# Patient Record
Sex: Male | Born: 1965
Health system: Southern US, Community
[De-identification: ages and names within clinical notes are randomized; demographics above are authoritative.]

## PROBLEM LIST (undated history)

## (undated) DIAGNOSIS — IMO0002 Reserved for concepts with insufficient information to code with codable children: Secondary | ICD-10-CM

## (undated) DIAGNOSIS — I10 Essential (primary) hypertension: Secondary | ICD-10-CM

## (undated) DIAGNOSIS — E669 Obesity, unspecified: Secondary | ICD-10-CM

## (undated) HISTORY — DX: Obesity, unspecified: E66.9

## (undated) HISTORY — DX: Reserved for concepts with insufficient information to code with codable children: IMO0002

## (undated) HISTORY — PX: WRIST SURGERY: SHX841

## (undated) HISTORY — PX: OTHER SURGICAL HISTORY: SHX169

## (undated) HISTORY — DX: Essential (primary) hypertension: I10

---

## 2006-02-04 ENCOUNTER — Ambulatory Visit: Payer: Self-pay

## 2006-02-13 ENCOUNTER — Other Ambulatory Visit: Payer: Self-pay

## 2006-02-14 ENCOUNTER — Ambulatory Visit: Payer: Self-pay | Admitting: Unknown Physician Specialty

## 2015-01-28 ENCOUNTER — Other Ambulatory Visit: Payer: Self-pay | Admitting: Family Medicine

## 2015-03-04 ENCOUNTER — Other Ambulatory Visit: Payer: Self-pay | Admitting: Family Medicine

## 2015-03-08 DIAGNOSIS — IMO0002 Reserved for concepts with insufficient information to code with codable children: Secondary | ICD-10-CM

## 2015-03-08 HISTORY — DX: Reserved for concepts with insufficient information to code with codable children: IMO0002

## 2015-03-14 ENCOUNTER — Other Ambulatory Visit: Payer: Self-pay | Admitting: Family Medicine

## 2015-03-14 ENCOUNTER — Encounter: Payer: Self-pay | Admitting: Family Medicine

## 2015-03-14 ENCOUNTER — Ambulatory Visit (INDEPENDENT_AMBULATORY_CARE_PROVIDER_SITE_OTHER): Payer: BLUE CROSS/BLUE SHIELD | Admitting: Family Medicine

## 2015-03-14 VITALS — BP 130/73 | HR 74 | Temp 98.0°F | Resp 16 | Ht 66.5 in | Wt 264.4 lb

## 2015-03-14 DIAGNOSIS — E669 Obesity, unspecified: Secondary | ICD-10-CM | POA: Diagnosis not present

## 2015-03-14 DIAGNOSIS — I10 Essential (primary) hypertension: Secondary | ICD-10-CM | POA: Diagnosis not present

## 2015-03-14 DIAGNOSIS — M722 Plantar fascial fibromatosis: Secondary | ICD-10-CM

## 2015-03-14 DIAGNOSIS — Z6841 Body Mass Index (BMI) 40.0 and over, adult: Secondary | ICD-10-CM

## 2015-03-14 DIAGNOSIS — N509 Disorder of male genital organs, unspecified: Secondary | ICD-10-CM | POA: Diagnosis not present

## 2015-03-14 DIAGNOSIS — Z0000 Encounter for general adult medical examination without abnormal findings: Secondary | ICD-10-CM

## 2015-03-14 DIAGNOSIS — N5089 Other specified disorders of the male genital organs: Secondary | ICD-10-CM

## 2015-03-14 MED ORDER — LOSARTAN POTASSIUM-HCTZ 100-25 MG PO TABS: 1.0000 | ORAL_TABLET | Freq: Every day | ORAL | Status: DC

## 2015-03-14 MED ORDER — IBUPROFEN 200 MG PO TABS: ORAL_TABLET | ORAL | Status: DC

## 2015-03-14 NOTE — Patient Instructions (Addendum)
To get Flu shot and Tdap in 1-2 weeks after finishing antibiotics for finger infection.  Ice to foot 3-4 times a day.

## 2015-03-14 NOTE — Progress Notes (Signed)
Name: Richard Wheeler   MRN: [id number]    DOB: 10/03/1965   Date:03/14/2015       Progress Note  Subjective  Chief Complaint  Chief Complaint  Patient presents with  . Annual Exam    Some heel pain left side now and then. 2-3 months     HPI  Here for annual Complete physical exam.  Has HBP and takes meds.  C/o some L heel pain for several months, worse after sitting on a hard surface.  Also ad with first standing after sleeping.    Had infected cuticle of L 4th finger.  Being treated with antibiotics and it is healing well. No problem-specific assessment & plan notes found for this encounter.   Past Medical History  Diagnosis Date  . Hypertension   . Obesity   . Infected cuticle 03/08/2015    Left Hand 4th digit     Past Surgical History  Procedure Laterality Date  . Left knee surgery      Family History  Problem Relation Age of Onset  . Cancer Mother 4552    Social History   Social History  . Marital Status: Married    Spouse Name: N/A  . Number of Children: N/A  . Years of Education: N/A   Occupational History  . Not on file.   Social History Main Topics  . Smoking status: Never Smoker   . Smokeless tobacco: Never Used  . Alcohol Use: No  . Drug Use: No  . Sexual Activity: Not on file   Other Topics Concern  . Not on file   Social History Narrative  . No narrative on file     Current outpatient prescriptions:  .  cephALEXin (KEFLEX) 500 MG capsule, Take 500 mg by mouth 2 (two) times daily., Disp: , Rfl:  .  losartan-hydrochlorothiazide (HYZAAR) 100-25 MG tablet, Take 1 tablet by mouth daily., Disp: 30 tablet, Rfl: 12 .  Multiple Vitamin (MULTIVITAMIN WITH MINERALS) TABS tablet, Take 1 tablet by mouth daily., Disp: , Rfl:  .  sulfamethoxazole-trimethoprim (BACTRIM DS,SEPTRA DS) 800-160 MG tablet, Take 1 tablet by mouth 2 (two) times daily., Disp: , Rfl:  .  ibuprofen (ADVIL) 200 MG tablet, Take 2 tablet up to 4 times a day for foot pain, Disp: 100  tablet, Rfl: 12  No Known Allergies   Review of Systems  Constitutional: Negative for fever, chills, weight loss and malaise/fatigue.  HENT: Negative for congestion and hearing loss.   Eyes: Negative for blurred vision, double vision and pain.  Respiratory: Negative for cough, sputum production, shortness of breath and wheezing.   Cardiovascular: Negative for chest pain, palpitations, orthopnea and leg swelling.  Gastrointestinal: Positive for heartburn (occasional). Negative for nausea, vomiting, abdominal pain, diarrhea and blood in stool.  Genitourinary: Negative for dysuria, urgency and frequency.  Musculoskeletal: Positive for joint pain (L heel pain on and off.). Negative for myalgias.  Skin: Negative for rash.  Neurological: Negative for dizziness, tremors, sensory change, focal weakness, weakness and headaches.  Psychiatric/Behavioral: Negative for depression. The patient is not nervous/anxious and does not have insomnia.       Objective  Filed Vitals:   03/14/15 1509  BP: 130/73  Pulse: 74  Temp: 98 F (36.7 C)  Resp: 16  Height: 5' 6.5" (1.689 m)  Weight: 264 lb 6.4 oz (119.931 kg)    Physical Exam  Constitutional: He is oriented to person, place, and time and well-developed, well-nourished, and in no distress. No distress.  HENT:  Head: Normocephalic and atraumatic.  Right Ear: External ear normal.  Nose: Nose normal.  Mouth/Throat: Oropharynx is clear and moist.  Eyes: Conjunctivae and EOM are normal. Pupils are equal, round, and reactive to light. Right eye exhibits no discharge. Left eye exhibits no discharge. No scleral icterus.  Fundoscopic exam:      The right eye shows no arteriolar narrowing, no AV nicking, no exudate and no hemorrhage.       The left eye shows no arteriolar narrowing, no AV nicking, no exudate and no hemorrhage.  Neck: Normal range of motion. Neck supple. Carotid bruit is not present. No thyromegaly present.  Cardiovascular: Normal  rate, regular rhythm, normal heart sounds and intact distal pulses.  Exam reveals no gallop and no friction rub.   No murmur heard. Pulmonary/Chest: Effort normal and breath sounds normal. No respiratory distress. He has no wheezes. He has no rales. He exhibits no tenderness.  Abdominal: Soft. Bowel sounds are normal. He exhibits no distension, no abdominal bruit and no mass. There is no tenderness.  Genitourinary: Penis normal.  Round smooth mass (~1.5 cm) above L testis. Not tender.  Musculoskeletal: Normal range of motion. He exhibits no edema or tenderness.  L heel tender over plantar fascia insertion.  Lymphadenopathy:    He has no cervical adenopathy.  Neurological: He is alert and oriented to person, place, and time. No cranial nerve deficit.  Skin: Skin is warm and dry. No rash noted. No erythema. No pallor.  Healing cellulitis of cuticle of L. 4th finger  Psychiatric: Mood, memory, affect and judgment normal.  Vitals reviewed.      No results found for this or any previous visit (from the past 2160 hour(s)).   Assessment & Plan  Problem List Items Addressed This Visit      Cardiovascular and Mediastinum   Hypertension   Relevant Medications   losartan-hydrochlorothiazide (HYZAAR) 100-25 MG tablet   Other Relevant Orders   Lipid Profile   Comprehensive Metabolic Panel (CMET)     Musculoskeletal and Integument   Plantar fasciitis     Other   Obesity - Primary   Annual physical exam   Scrotal mass   Relevant Orders   US Scrotum   CBC with Differential      Meds ordered this encounter  Medications  . Multiple Vitamin (MULTIVITAMIN WITH MINERALS) TABS tablet    Sig: Take 1 tablet by mouth daily.  . cephALEXin (KEFLEX) 500 MG capsule    Sig: Take 500 mg by mouth 2 (two) times daily.  Marland Kitchen sulfamethoxazole-trimethoprim (BACTRIM DS,SEPTRA DS) 800-160 MG tablet    Sig: Take 1 tablet by mouth 2 (two) times daily.  Marland Kitchen losartan-hydrochlorothiazide (HYZAAR) 100-25 MG  tablet    Sig: Take 1 tablet by mouth daily.    Dispense:  30 tablet    Refill:  12  . ibuprofen (ADVIL) 200 MG tablet    Sig: Take 2 tablet up to 4 times a day for foot pain    Dispense:  100 tablet    Refill:  12    OTC   1. Annual physical exam   2. Obesity   3. Essential hypertension  - Lipid Profile - Comprehensive Metabolic Panel (CMET) - losartan-hydrochlorothiazide (HYZAAR) 100-25 MG tablet; Take 1 tablet by mouth daily.  Dispense: 30 tablet; Refill: 12  4. Scrotal mass  - US Scrotum; Future - CBC with Differential  5. Plantar fasciitis  - ibuprofen (ADVIL) 200 MG tablet; Take 2 tablet up to  4 times a day for foot pain  Dispense: 100 tablet; Refill: 12

## 2015-03-14 NOTE — Addendum Note (Signed)
Addended by: Clayborne ArtistHOLDEN, Lealer Marsland A on: 03/14/2015 04:15 PM   Modules accepted: Orders, SmartSet

## 2015-03-18 ENCOUNTER — Ambulatory Visit
Admission: RE | Admit: 2015-03-18 | Discharge: 2015-03-18 | Disposition: A | Discharge status: Home / Self Care | Payer: BLUE CROSS/BLUE SHIELD | Source: Ambulatory Visit | Attending: Family Medicine | Admitting: Family Medicine

## 2015-03-18 DIAGNOSIS — N433 Hydrocele, unspecified: Secondary | ICD-10-CM | POA: Diagnosis not present

## 2015-03-18 DIAGNOSIS — N509 Disorder of male genital organs, unspecified: Secondary | ICD-10-CM | POA: Diagnosis present

## 2015-03-18 DIAGNOSIS — N503 Cyst of epididymis: Secondary | ICD-10-CM | POA: Diagnosis not present

## 2015-03-18 DIAGNOSIS — N5089 Other specified disorders of the male genital organs: Secondary | ICD-10-CM

## 2015-03-22 ENCOUNTER — Other Ambulatory Visit: Payer: Self-pay | Admitting: Family Medicine

## 2015-03-22 DIAGNOSIS — L729 Follicular cyst of the skin and subcutaneous tissue, unspecified: Secondary | ICD-10-CM

## 2015-03-29 LAB — CBC WITH DIFFERENTIAL/PLATELET
BASOS ABS: 0 10*3/uL (ref 0.0–0.2)
Basos: 1 %
EOS (ABSOLUTE): 0.1 10*3/uL (ref 0.0–0.4)
Eos: 1 %
Hematocrit: 41.1 % (ref 37.5–51.0)
Hemoglobin: 14.3 g/dL (ref 12.6–17.7)
IMMATURE GRANS (ABS): 0 10*3/uL (ref 0.0–0.1)
Immature Granulocytes: 0 %
LYMPHS: 58 %
Lymphocytes Absolute: 2.9 10*3/uL (ref 0.7–3.1)
MCH: 28 pg (ref 26.6–33.0)
MCHC: 34.8 g/dL (ref 31.5–35.7)
MCV: 80 fL (ref 79–97)
MONOCYTES: 7 %
Monocytes Absolute: 0.3 10*3/uL (ref 0.1–0.9)
Neutrophils Absolute: 1.6 10*3/uL (ref 1.4–7.0)
Neutrophils: 33 %
PLATELETS: 298 10*3/uL (ref 150–379)
RBC: 5.11 x10E6/uL (ref 4.14–5.80)
RDW: 15.9 % — AB (ref 12.3–15.4)
WBC: 5 10*3/uL (ref 3.4–10.8)

## 2015-03-30 LAB — LIPID PANEL
CHOL/HDL RATIO: 5.2 ratio — AB (ref 0.0–5.0)
Cholesterol, Total: 136 mg/dL (ref 100–199)
HDL: 26 mg/dL — ABNORMAL LOW (ref >39)
LDL CALC: 75 mg/dL (ref 0–99)
Triglycerides: 174 mg/dL — ABNORMAL HIGH (ref 0–149)
VLDL CHOLESTEROL CAL: 35 mg/dL (ref 5–40)

## 2015-03-30 LAB — COMPREHENSIVE METABOLIC PANEL
ALK PHOS: 70 IU/L (ref 39–117)
ALT: 30 IU/L (ref 0–44)
AST: 27 IU/L (ref 0–40)
Albumin/Globulin Ratio: 1.4 (ref 1.1–2.5)
Albumin: 4.2 g/dL (ref 3.5–5.5)
BUN/Creatinine Ratio: 10 (ref 9–20)
BUN: 10 mg/dL (ref 6–24)
Bilirubin Total: 0.4 mg/dL (ref 0.0–1.2)
CALCIUM: 9.2 mg/dL (ref 8.7–10.2)
CO2: 24 mmol/L (ref 18–29)
CREATININE: 1.02 mg/dL (ref 0.76–1.27)
Chloride: 99 mmol/L (ref 97–106)
GFR calc Af Amer: 101 mL/min/{1.73_m2} (ref >59)
GFR calc non Af Amer: 87 mL/min/{1.73_m2} (ref >59)
GLOBULIN, TOTAL: 3.1 g/dL (ref 1.5–4.5)
GLUCOSE: 88 mg/dL (ref 65–99)
Potassium: 4.4 mmol/L (ref 3.5–5.2)
SODIUM: 136 mmol/L (ref 136–144)
Total Protein: 7.3 g/dL (ref 6.0–8.5)

## 2015-04-05 ENCOUNTER — Encounter: Payer: Self-pay | Admitting: Urology

## 2015-04-05 ENCOUNTER — Ambulatory Visit (INDEPENDENT_AMBULATORY_CARE_PROVIDER_SITE_OTHER): Payer: BLUE CROSS/BLUE SHIELD | Admitting: Urology

## 2015-04-05 VITALS — BP 161/78 | HR 93 | Ht 67.0 in | Wt 262.8 lb

## 2015-04-05 DIAGNOSIS — R3911 Hesitancy of micturition: Secondary | ICD-10-CM

## 2015-04-05 DIAGNOSIS — N4341 Spermatocele of epididymis, single: Secondary | ICD-10-CM

## 2015-04-05 NOTE — Progress Notes (Signed)
04/05/2015 4:08 PM   Gustavus BryantJonathan Piper [birth year] [id number]  Referring provider: Janeann ForehandJames H Hawkins Jr., MD [address] [place], [place] [id number]  Chief Complaint  Patient presents with  . Scrotal cyst    New Patient    HPI: 49 year old male referred by Dr. Juanetta GoslingHawkins for evaluation of a left epididymal cyst.  He reports that it has been present for 7-8 years and stable in size. It does occasionally cause him discomfort no more than once a week, dull aching pain which is very mild.  Patient underwent further workup including an ultrasound of the scrotum on 03/18/2015 which showed a 3 cm left epididymal head cyst, otherwise no scrotal pathology other than small bilateral hydroceles. Normal testicles bilaterally.  He does also complain today of some mild urinary hesitancy in the morning along with occasional double voiding first thing. He otherwise has no urinary symptoms or complaints today. He was told by his PCP that his prostate is enlarged.   PMH: Past Medical History  Diagnosis Date  . Hypertension   . Obesity   . Infected cuticle 03/08/2015    Left Hand 4th digit     Surgical History: Past Surgical History  Procedure Laterality Date  . Left knee surgery      Home Medications:    Medication List       This list is accurate as of: 04/05/15  4:08 PM.  Always use your most recent med list.               ibuprofen 200 MG tablet  Commonly known as:  ADVIL  Take 2 tablet up to 4 times a day for foot pain     losartan-hydrochlorothiazide 100-25 MG tablet  Commonly known as:  HYZAAR  Take 1 tablet by mouth daily.     multivitamin with minerals Tabs tablet  Take 1 tablet by mouth daily.        Allergies: No Known Allergies  Family History: Family History  Problem Relation Age of Onset  . Cancer Mother 7252  . Prostate cancer Neg Hx   . Bladder Cancer Neg Hx   . Kidney cancer Neg Hx     Social History:  reports that he has never smoked. He has never used  smokeless tobacco. He reports that he does not drink alcohol or use illicit drugs.  ROS: UROLOGY Frequent Urination?: No Hard to postpone urination?: No Burning/pain with urination?: No Get up at night to urinate?: No Leakage of urine?: No Urine stream starts and stops?: No Trouble starting stream?: No Do you have to strain to urinate?: No Blood in urine?: No Urinary tract infection?: No Sexually transmitted disease?: No Injury to kidneys or bladder?: No Painful intercourse?: No Weak stream?: No Erection problems?: No Penile pain?: No  Gastrointestinal Nausea?: No Vomiting?: No Indigestion/heartburn?: No Diarrhea?: No Constipation?: No  Constitutional Fever: No Night sweats?: No Weight loss?: No Fatigue?: No  Skin Skin rash/lesions?: No Itching?: No  Eyes Blurred vision?: No Double vision?: No  Ears/Nose/Throat Sore throat?: No Sinus problems?: No  Hematologic/Lymphatic Swollen glands?: No Easy bruising?: No  Cardiovascular Leg swelling?: No Chest pain?: No  Respiratory Cough?: No Shortness of breath?: No  Endocrine Excessive thirst?: No  Musculoskeletal Back pain?: No Joint pain?: No  Neurological Headaches?: No Dizziness?: No  Psychologic Depression?: No Anxiety?: No  Physical Exam: BP 161/78 mmHg  Pulse 93  Ht 5\' 7"  (1.702 m)  Wt 262 lb 12.8 oz (119.205 kg)  BMI 41.15 kg/m2  Constitutional:  Alert and oriented, No acute distress. HEENT: [place] AT, moist mucus membranes.  Trachea midline, no masses. Cardiovascular: No clubbing, cyanosis, or edema. Respiratory: Normal respiratory effort, no increased work of breathing. GI: Abdomen is soft, nontender, nondistended, no abdominal masses GU: No CVA tenderness. Circumcised phallus with easily retractable slightly redundant penile shaft skin. Scrotum unremarkable with normal testicles bilaterally, descended. Right epididymis normal. Left epididymis with tense approximate 3 cm cystic mass,  nontender. Skin: No rashes, bruises or suspicious lesions. Lymph: No inguinal adenopathy. Neurologic: Grossly intact, no focal deficits, moving all 4 extremities. Psychiatric: Normal mood and affect.  Laboratory Data: Lab Results  Component Value Date   WBC 5.0 03/29/2015   HCT 41.1 03/29/2015    Lab Results  Component Value Date   CREATININE 1.02 03/29/2015    Pertinent Imaging: CLINICAL DATA: Palpable left sided scrotal mass for at least the past 5 years  EXAM: SCROTAL ULTRASOUND  DOPPLER ULTRASOUND OF THE TESTICLES  TECHNIQUE: Complete ultrasound examination of the testicles, epididymis, and other scrotal structures was performed. Color and spectral Doppler ultrasound were also utilized to evaluate blood flow to the testicles.  COMPARISON: None in PACs  FINDINGS: Right testicle  Measurements: 4.5 x 2.6 x 3 cm. No mass or microlithiasis visualized.  Left testicle  Measurements: 3.9 x 2.3 x 2.7 cm. No mass or microlithiasis visualized.  Right epididymis: Normal in size and appearance.  Left epididymis: There is a cyst in the epididymal head measuring 2 x 2.2 x 3.0 cm. Surrounding vascularity is normal.  Hydrocele: There are small bilateral hydroceles.  Varicocele: None visualized.  Pulsed Doppler interrogation of both testes demonstrates normal low resistance arterial and venous waveforms bilaterally.  IMPRESSION: 1. Large left epididymal head cyst measuring up to 3 cm in greatest dimension. There is no evidence of acute epididymitis. The right epididymis is unremarkable. 2. The testes exhibit normal echotexture with no evidence of masses. Vascularity of the testes is normal. 3. Small bilateral hydroceles.   Electronically Signed  By: David Swaziland M.D.  On: 03/18/2015 16:43  Assessment & Plan:    1. Spermatocele of epididymis, single Physical exam and scrotal ultrasound consistent with left 3 cm benign spermatocele. We  discussed possible intervention including spermatocele ectomy versus epididymectomy. Pictures were drawn to explain the anatomy today. We discussed the surgical procedure itself as well as postoperative recovery.  He is relatively asymptomatic and only has occasional discomfort. He reports that he is not bothered enough to consider surgery and simply wants to be insured that this is benign and not worrisome.  He is advised to return should he ever elect to proceed with surgical excision.  2. Urinary hesitancy Mild occasional early morning urinary hesitancy, minimal bother. We briefly discussed starting medication for this such as Flomax, however, the patient is not bothered enough to start medication. He was advised to continue to follow-up with his PCP and consider alpha blocker therapy should his symptoms worsen in the future.  Vanna Scotland, MD  [facility] [address], [address] [place], [place] [address] [phone]

## 2015-07-12 ENCOUNTER — Ambulatory Visit: Payer: Self-pay | Admitting: Family Medicine

## 2015-10-24 ENCOUNTER — Encounter: Payer: Self-pay | Admitting: Family Medicine

## 2015-10-24 ENCOUNTER — Ambulatory Visit (INDEPENDENT_AMBULATORY_CARE_PROVIDER_SITE_OTHER): Payer: BLUE CROSS/BLUE SHIELD | Admitting: Family Medicine

## 2015-10-24 VITALS — BP 125/75 | HR 66 | Temp 98.2°F | Resp 16 | Ht 67.0 in | Wt 263.0 lb

## 2015-10-24 DIAGNOSIS — I10 Essential (primary) hypertension: Secondary | ICD-10-CM | POA: Diagnosis not present

## 2015-10-24 DIAGNOSIS — S86819A Strain of other muscle(s) and tendon(s) at lower leg level, unspecified leg, initial encounter: Secondary | ICD-10-CM

## 2015-10-24 DIAGNOSIS — S86919A Strain of unspecified muscle(s) and tendon(s) at lower leg level, unspecified leg, initial encounter: Secondary | ICD-10-CM

## 2015-10-24 MED ORDER — MELOXICAM 15 MG PO TABS: 15.0000 mg | ORAL_TABLET | Freq: Every day | ORAL | Status: DC

## 2015-10-24 NOTE — Progress Notes (Signed)
Name: Richard BryantJonathan Slawinski   MRN: [id number]    DOB: 1965/12/08   Date:10/24/2015       Progress Note  Subjective  Chief Complaint  Chief Complaint  Patient presents with  . Knee Pain    HPI Here c/o bilateral knee pain on and off for past 6 weeks.  Takes OTC Advil only occ.  They are starting to feel a little better this weekend..  No trauma.  No problem-specific assessment & plan notes found for this encounter.   Past Medical History  Diagnosis Date  . Hypertension   . Obesity   . Infected cuticle 03/08/2015    Left Hand 4th digit     Social History  Substance Use Topics  . Smoking status: Never Smoker   . Smokeless tobacco: Never Used  . Alcohol Use: No     Current outpatient prescriptions:  .  ibuprofen (ADVIL) 200 MG tablet, Take 2 tablet up to 4 times a day for foot pain (Patient taking differently: Take 200 mg by mouth every 8 (eight) hours as needed. Take 2 tablet up to 4 times a day for foot pain), Disp: 100 tablet, Rfl: 12 .  losartan-hydrochlorothiazide (HYZAAR) 100-25 MG tablet, Take 1 tablet by mouth daily., Disp: 30 tablet, Rfl: 12 .  Multiple Vitamin (MULTIVITAMIN WITH MINERALS) TABS tablet, Take 1 tablet by mouth daily., Disp: , Rfl:   Not on File  Review of Systems  Constitutional: Negative for fever, chills, weight loss and malaise/fatigue.  HENT: Negative for hearing loss.   Eyes: Negative for blurred vision and double vision.  Respiratory: Negative for cough, shortness of breath and wheezing.   Cardiovascular: Negative for chest pain, palpitations and leg swelling.  Gastrointestinal: Negative for heartburn, abdominal pain and blood in stool.  Genitourinary: Negative for dysuria, urgency and frequency.  Musculoskeletal: Positive for joint pain (Bilateral knee pain with some occ knee swelling.  Intermittant).  Skin: Negative for rash.  Neurological: Negative for dizziness, tremors, weakness and headaches.      Objective  Filed Vitals:   10/24/15  0805  BP: 145/84  Pulse: 66  Temp: 98.2 F (36.8 C)  TempSrc: Oral  Resp: 16  Height: 5\' 7"  (1.702 m)  Weight: 263 lb (119.296 kg)     Physical Exam  Constitutional: He is oriented to person, place, and time and well-developed, well-nourished, and in no distress. No distress.  HENT:  Head: Normocephalic and atraumatic.  Eyes: Conjunctivae and EOM are normal. Pupils are equal, round, and reactive to light. No scleral icterus.  Neck: Normal range of motion. Neck supple. Carotid bruit is not present. No thyromegaly present.  Cardiovascular: Normal rate, regular rhythm and normal heart sounds.  Exam reveals no gallop and no friction rub.   No murmur heard. Pulmonary/Chest: Effort normal and breath sounds normal. No respiratory distress. He has no wheezes. He has no rales.  Musculoskeletal: He exhibits no edema.  Bilateral knees with minor pain to palp and with ROM at medial joint line.  No swelling.  No ligamentous laxity  Lymphadenopathy:    He has no cervical adenopathy.  Neurological: He is alert and oriented to person, place, and time.  Vitals reviewed.     No results found for this or any previous visit (from the past 2160 hour(s)).   Assessment & Plan  1. Knee strain, unspecified laterality, initial encounter  - meloxicam (MOBIC) 15 MG tablet; Take 1 tablet (15 mg total) by mouth daily.  Dispense: 30 tablet; Refill: 6  2. Essential hypertension Cont Hyzaar

## 2015-11-21 ENCOUNTER — Encounter: Payer: Self-pay | Admitting: Family Medicine

## 2015-11-21 ENCOUNTER — Ambulatory Visit (INDEPENDENT_AMBULATORY_CARE_PROVIDER_SITE_OTHER): Payer: BLUE CROSS/BLUE SHIELD | Admitting: Family Medicine

## 2015-11-21 VITALS — BP 140/90 | HR 92 | Temp 98.3°F | Resp 16 | Ht 67.0 in | Wt 259.0 lb

## 2015-11-21 DIAGNOSIS — J029 Acute pharyngitis, unspecified: Secondary | ICD-10-CM

## 2015-11-21 LAB — POCT RAPID STREP A (OFFICE): RAPID STREP A SCREEN: NEGATIVE

## 2015-11-21 MED ORDER — PENICILLIN V POTASSIUM 500 MG PO TABS: 500.0000 mg | ORAL_TABLET | Freq: Three times a day (TID) | ORAL | Status: AC

## 2015-11-21 NOTE — Progress Notes (Signed)
Name: Richard Wheeler   MRN: [id number]    DOB: 18-Oct-1965   Date:11/21/2015       Progress Note  Subjective  Chief Complaint  Chief Complaint  Patient presents with  . Sore Throat    x 3    HPI Here c/o sore throat for past 3 days.  Slowly getting better. His sister has Strep throat.  No cough.  Had fever to 102 ,2 days ago. But normal now.  No problem-specific assessment & plan notes found for this encounter.   Past Medical History  Diagnosis Date  . Hypertension   . Obesity   . Infected cuticle 03/08/2015    Left Hand 4th digit     Social History  Substance Use Topics  . Smoking status: Never Smoker   . Smokeless tobacco: Never Used  . Alcohol Use: No     Current outpatient prescriptions:  .  losartan-hydrochlorothiazide (HYZAAR) 100-25 MG tablet, Take 1 tablet by mouth daily., Disp: 30 tablet, Rfl: 12 .  meloxicam (MOBIC) 15 MG tablet, Take 1 tablet (15 mg total) by mouth daily., Disp: 30 tablet, Rfl: 6 .  Multiple Vitamin (MULTIVITAMIN WITH MINERALS) TABS tablet, Take 1 tablet by mouth daily., Disp: , Rfl:  .  penicillin v potassium (VEETID) 500 MG tablet, Take 1 tablet (500 mg total) by mouth 3 (three) times daily., Disp: 30 tablet, Rfl: 0  Not on File  Review of Systems  Constitutional: Positive for fever and chills. Negative for weight loss and malaise/fatigue.  HENT: Positive for sore throat. Negative for hearing loss.   Eyes: Negative for blurred vision and double vision.  Respiratory: Negative for cough, sputum production, shortness of breath and wheezing.   Cardiovascular: Negative for chest pain, palpitations and PND.  Gastrointestinal: Negative for heartburn, abdominal pain and blood in stool.  Genitourinary: Negative for dysuria, urgency and frequency.  Skin: Negative for rash.  Neurological: Negative for weakness and headaches.      Objective  Filed Vitals:   11/21/15 1518 11/21/15 1547  BP: 163/90 140/90  Pulse: 92   Temp: 98.3 F (36.8  C)   TempSrc: Oral   Resp: 16   Height: 5\' 7"  (1.702 m)   Weight: 259 lb (117.482 kg)      Physical Exam  Constitutional: He is well-developed, well-nourished, and in no distress. No distress.  HENT:  Head: Normocephalic and atraumatic.  Right Ear: External ear normal.  Left Ear: External ear normal.  Nose: Nose normal.  Mouth/Throat: Posterior oropharyngeal edema (tonsils swollen and red) and posterior oropharyngeal erythema present. Tonsillar abscesses: +/- exudate bilaterally.  Cardiovascular: Normal rate, regular rhythm and normal heart sounds.   Pulmonary/Chest: No respiratory distress. He has no wheezes. He has no rales.  Vitals reviewed.     Recent Results (from the past 2160 hour(s))  POCT rapid strep A     Status: Normal   Collection Time: 11/21/15  3:42 PM  Result Value Ref Range   Rapid Strep A Screen Negative Negative     Assessment & Plan  1. Sore throat  - POCT rapid strep A-neg  2. Pharyngitis  - penicillin v potassium (VEETID) 500 MG tablet; Take 1 tablet (500 mg total) by mouth 3 (three) times daily.  Dispense: 30 tablet; Refill: 0

## 2015-12-27 ENCOUNTER — Encounter: Payer: BLUE CROSS/BLUE SHIELD | Admitting: Family Medicine

## 2016-01-03 ENCOUNTER — Encounter: Payer: BLUE CROSS/BLUE SHIELD | Admitting: Family Medicine

## 2016-01-05 ENCOUNTER — Ambulatory Visit (INDEPENDENT_AMBULATORY_CARE_PROVIDER_SITE_OTHER): Payer: BLUE CROSS/BLUE SHIELD | Admitting: Family Medicine

## 2016-01-05 ENCOUNTER — Encounter: Payer: Self-pay | Admitting: Family Medicine

## 2016-01-05 VITALS — BP 145/100 | HR 75 | Temp 97.9°F | Resp 16 | Ht 67.0 in | Wt 263.0 lb

## 2016-01-05 DIAGNOSIS — M1990 Unspecified osteoarthritis, unspecified site: Secondary | ICD-10-CM

## 2016-01-05 DIAGNOSIS — K602 Anal fissure, unspecified: Secondary | ICD-10-CM

## 2016-01-05 DIAGNOSIS — I10 Essential (primary) hypertension: Secondary | ICD-10-CM

## 2016-01-05 DIAGNOSIS — Z0000 Encounter for general adult medical examination without abnormal findings: Secondary | ICD-10-CM | POA: Diagnosis not present

## 2016-01-05 DIAGNOSIS — Z23 Encounter for immunization: Secondary | ICD-10-CM

## 2016-01-05 DIAGNOSIS — E669 Obesity, unspecified: Secondary | ICD-10-CM | POA: Diagnosis not present

## 2016-01-05 MED ORDER — LOSARTAN POTASSIUM-HCTZ 100-25 MG PO TABS: 1.0000 | ORAL_TABLET | Freq: Every day | ORAL | 12 refills | Status: DC

## 2016-01-05 MED ORDER — HYDROCORTISONE ACETATE 25 MG RE SUPP: 25.0000 mg | Freq: Two times a day (BID) | RECTAL | 0 refills | Status: DC

## 2016-01-05 MED ORDER — AMLODIPINE BESYLATE 2.5 MG PO TABS: 2.5000 mg | ORAL_TABLET | Freq: Every day | ORAL | 3 refills | Status: DC

## 2016-01-05 NOTE — Progress Notes (Signed)
Name: Richard BryantJonathan Rody   MRN: [id number]    DOB: 11-22-1965   Date:01/05/2016       Progress Note  Subjective  Chief Complaint  Chief Complaint  Patient presents with  . Annual Exam  . Hypertension  . Arthritis    HPI Here for annual health maintence exam.  He has HBP and some arthritis of his knees.  Also obese. Take meds as directed.  Uses Mobic prn only.  No problem-specific Assessment & Plan notes found for this encounter.   Past Medical History:  Diagnosis Date  . Hypertension   . Infected cuticle 03/08/2015   Left Hand 4th digit   . Obesity     Past Surgical History:  Procedure Laterality Date  . left knee surgery      Family History  Problem Relation Age of Onset  . Cancer Mother 1152  . Prostate cancer Neg Hx   . Bladder Cancer Neg Hx   . Kidney cancer Neg Hx     Social History   Social History  . Marital status: Married    Spouse name: N/A  . Number of children: N/A  . Years of education: N/A   Occupational History  . Not on file.   Social History Main Topics  . Smoking status: Never Smoker  . Smokeless tobacco: Never Used  . Alcohol use No  . Drug use: No  . Sexual activity: Not on file   Other Topics Concern  . Not on file   Social History Narrative  . No narrative on file     Current Outpatient Prescriptions:  .  losartan-hydrochlorothiazide (HYZAAR) 100-25 MG tablet, Take 1 tablet by mouth daily., Disp: 30 tablet, Rfl: 12 .  meloxicam (MOBIC) 15 MG tablet, Take 1 tablet (15 mg total) by mouth daily., Disp: 30 tablet, Rfl: 6 .  Multiple Vitamin (MULTIVITAMIN WITH MINERALS) TABS tablet, Take 1 tablet by mouth daily., Disp: , Rfl:  .  amLODipine (NORVASC) 2.5 MG tablet, Take 1 tablet (2.5 mg total) by mouth daily., Disp: 90 tablet, Rfl: 3 .  hydrocortisone (ANUSOL-HC) 25 MG suppository, Place 1 suppository (25 mg total) rectally 2 (two) times daily., Disp: 12 suppository, Rfl: 0  Not on File   Review of Systems  Constitutional:  Negative for chills, fever, malaise/fatigue and weight loss.  HENT: Negative for hearing loss.   Eyes: Negative for blurred vision and double vision.  Respiratory: Negative for cough, shortness of breath and wheezing.   Cardiovascular: Negative for chest pain, palpitations and leg swelling.  Gastrointestinal: Positive for blood in stool (with large BMs.). Negative for abdominal pain and heartburn.  Genitourinary: Negative for dysuria, frequency and urgency.  Musculoskeletal: Positive for joint pain (knees). Negative for myalgias.  Skin: Negative for rash.  Neurological: Negative for dizziness, tremors, weakness and headaches.  Psychiatric/Behavioral: Negative for depression. The patient is not nervous/anxious and does not have insomnia.       Objective  Vitals:   01/05/16 1357 01/05/16 1422  BP: (!) 150/100 (!) 145/100  Pulse: 75   Resp: 16   Temp: 97.9 F (36.6 C)   TempSrc: Oral   Weight: 263 lb (119.3 kg)   Height: 5\' 7"  (1.702 m)     Physical Exam  Constitutional: He is oriented to person, place, and time and well-developed, well-nourished, and in no distress. No distress.  HENT:  Head: Normocephalic and atraumatic.  Right Ear: External ear normal.  Left Ear: External ear normal.  Nose: Nose normal.  Mouth/Throat:  Oropharynx is clear and moist.  Eyes: Conjunctivae and EOM are normal. Pupils are equal, round, and reactive to light. No scleral icterus.  Fundoscopic exam:      The right eye shows no arteriolar narrowing, no AV nicking, no exudate, no hemorrhage and no papilledema.       The left eye shows no arteriolar narrowing, no AV nicking, no exudate, no hemorrhage and no papilledema.  Neck: Normal range of motion. Neck supple. Carotid bruit is not present. No thyromegaly present.  Cardiovascular: Normal rate, regular rhythm, normal heart sounds and intact distal pulses.  Exam reveals no gallop and no friction rub.   No murmur heard. Pulmonary/Chest: Effort normal  and breath sounds normal. No respiratory distress. He has no wheezes. He has no rales. He exhibits no tenderness.  Abdominal: Soft. Bowel sounds are normal. He exhibits no distension and no mass. There is no tenderness. There is no rebound.  Genitourinary: Penis normal.  Genitourinary Comments: Small anal fissure at 6:00, and small internal hemorrhoid.  No active bleeding noted  Musculoskeletal: Normal range of motion. He exhibits no edema or deformity.  Lymphadenopathy:    He has no cervical adenopathy.  Neurological: He is alert and oriented to person, place, and time. No cranial nerve deficit. Gait normal.  Skin: Skin is warm and dry.  Psychiatric: Mood, memory, affect and judgment normal.       Recent Results (from the past 2160 hour(s))  POCT rapid strep A     Status: Normal   Collection Time: 11/21/15  3:42 PM  Result Value Ref Range   Rapid Strep A Screen Negative Negative     Assessment & Plan  Problem List Items Addressed This Visit      Cardiovascular and Mediastinum   Hypertension - Primary   Relevant Medications   amLODipine (NORVASC) 2.5 MG tablet   losartan-hydrochlorothiazide (HYZAAR) 100-25 MG tablet     Digestive   Anal fissure   Relevant Medications   hydrocortisone (ANUSOL-HC) 25 MG suppository     Musculoskeletal and Integument   Arthritis     Other   Obesity   Health maintenance examination    Other Visit Diagnoses   None.     Meds ordered this encounter  Medications  . amLODipine (NORVASC) 2.5 MG tablet    Sig: Take 1 tablet (2.5 mg total) by mouth daily.    Dispense:  90 tablet    Refill:  3  . hydrocortisone (ANUSOL-HC) 25 MG suppository    Sig: Place 1 suppository (25 mg total) rectally 2 (two) times daily.    Dispense:  12 suppository    Refill:  0  . losartan-hydrochlorothiazide (HYZAAR) 100-25 MG tablet    Sig: Take 1 tablet by mouth daily.    Dispense:  30 tablet    Refill:  12   1. Health maintenance examination   2.  Essential hypertension  - amLODipine (NORVASC) 2.5 MG tablet; Take 1 tablet (2.5 mg total) by mouth daily.  Dispense: 90 tablet; Refill: 3 - losartan-hydrochlorothiazide (HYZAAR) 100-25 MG tablet; Take 1 tablet by mouth daily.  Dispense: 30 tablet; Refill: 12  3. Obesity Discussed weight loss.  4. Arthritis Use Mobic prn  5. Anal fissure  - hydrocortisone (ANUSOL-HC) 25 MG suppository; Place 1 suppository (25 mg total) rectally 2 (two) times daily.  Dispense: 12 suppository; Refill: 0

## 2016-01-05 NOTE — Addendum Note (Signed)
Addended by: Alease FrameARTER, SONYA S on: 01/05/2016 02:48 PM   Modules accepted: Orders

## 2016-02-27 ENCOUNTER — Ambulatory Visit: Payer: BLUE CROSS/BLUE SHIELD | Admitting: Family Medicine

## 2016-02-27 DIAGNOSIS — Z0289 Encounter for other administrative examinations: Secondary | ICD-10-CM

## 2016-03-06 ENCOUNTER — Ambulatory Visit: Payer: BLUE CROSS/BLUE SHIELD | Admitting: Family Medicine

## 2016-04-16 ENCOUNTER — Encounter: Payer: BLUE CROSS/BLUE SHIELD | Admitting: Family Medicine

## 2016-05-02 ENCOUNTER — Ambulatory Visit (INDEPENDENT_AMBULATORY_CARE_PROVIDER_SITE_OTHER): Payer: BLUE CROSS/BLUE SHIELD | Admitting: Family Medicine

## 2016-05-02 ENCOUNTER — Encounter: Payer: Self-pay | Admitting: Family Medicine

## 2016-05-02 VITALS — BP 122/77 | HR 92 | Temp 97.4°F | Resp 16 | Ht 67.0 in | Wt 272.2 lb

## 2016-05-02 DIAGNOSIS — Z131 Encounter for screening for diabetes mellitus: Secondary | ICD-10-CM

## 2016-05-02 DIAGNOSIS — I10 Essential (primary) hypertension: Secondary | ICD-10-CM | POA: Diagnosis not present

## 2016-05-02 DIAGNOSIS — E785 Hyperlipidemia, unspecified: Secondary | ICD-10-CM | POA: Insufficient documentation

## 2016-05-02 DIAGNOSIS — E782 Mixed hyperlipidemia: Secondary | ICD-10-CM | POA: Diagnosis not present

## 2016-05-02 DIAGNOSIS — Z6841 Body Mass Index (BMI) 40.0 and over, adult: Secondary | ICD-10-CM | POA: Diagnosis not present

## 2016-05-02 DIAGNOSIS — R7303 Prediabetes: Secondary | ICD-10-CM

## 2016-05-02 DIAGNOSIS — K602 Anal fissure, unspecified: Secondary | ICD-10-CM | POA: Diagnosis not present

## 2016-05-02 NOTE — Assessment & Plan Note (Signed)
Well controlled HTN on 3 drug regimen, outside BP seems comparable No known complication  Plan: 1. Continue Losartan-HCTZ 100-25mg , and Amlodipine 2.5mg  daily 2. Encouraged improve lifestyle mods with regular exercise (some limited by knee pain), low sodium diet 3. Check future chemistry, screening A1c, [facility] labs and then follow-up 6 mo for follow-up BP

## 2016-05-02 NOTE — Patient Instructions (Signed)
Thank you for coming in to clinic today.  1. Future fasting labs ordered, through [facility].  2. Will follow-up on results - Most likely if elevated Triglycerides, will recommend starting Fish Oil OTC 1000mg  ranging 2-4 a day follow the bottle instructions - Regular exercise walking will help control BP and cholesterol - also screening for Diabetes  For heartburn / indigestion - Try Zantac 150 once or twice a day for 2-4 weeks - OR Prilosec / Omeprazole 20mg  pill 30 min before eating first meal of day for 2-4 weeks, then see how you do off of it  For anal fissure May try OTc Preparation H cream as needed = May need stool softener, increase fluid or miralax if constipation  Please schedule a follow-up appointment with Dr. Althea CharonKaramalegos in 6 months follow-up BP, Cholesterol  If you have any other questions or concerns, please feel free to call the clinic or send a message through MyChart. You may also schedule an earlier appointment if necessary.  Saralyn PilarAlexander Karamalegos, DO [facility]

## 2016-05-02 NOTE — Assessment & Plan Note (Signed)
Abnormal TG >170, low HDL 26, normal LDL 75 - Not on cholesterol med or statin therapy  Plan: 1. Check future fasting lipids, follow-up results, calculate ASCVD risk, consider statin 2. Today recommend trial OTC Fish Oil up to 1-2g BID for hyperTG, improve lifestyle and exercise for HDL 3. Follow-up 6 months

## 2016-05-02 NOTE — Assessment & Plan Note (Signed)
By history, did not examine today, mild recurrence, previously resolved without therapy - Recommend trial topical preparation H, fiber diet, hydration, consider metamucil for fiber or miralax if constipated - Follow-up sooner if needed, consider Diltiazem topical rx

## 2016-05-02 NOTE — Progress Notes (Signed)
Subjective:    Patient ID: Richard Wheeler Wheeler, male    DOB: 06-28-1965, 50 y.o.   MRN: [id number]  Richard Wheeler Richard Wheeler is a 50 y.o. male presenting on 05/02/2016 for Hypertension   HPI   CHRONIC HTN: Reports here for BP follow-up, outside BP at [employer] occasionally 120-130/80-90. Current Meds - Losartan-HCTZ 100-25mg  daily, Amlodipine 2.5mg  (recently added 4 months ago) Reports good compliance, took meds today. Tolerating well, w/o complaints. Lifestyle - Not started regular exercise back due to some knee pain, low salt diet Denies CP, dyspnea, HA, edema, dizziness / lightheadedness  HYPERLIPIDEMIA: - Reports prior known history of HyperTG. Request future fasting labs to re-check cholesterol. Last lipid panel 03/2015, with elevated TG 174, low HDL 26, and LDL 75. - Never on statin therapy  Screening Diabetes / Morbid Obesity BMI >42 - Reports weight gain +7-9 lbs in 4 months, no prior history of abnormal glucose or significant family history of DM, has been several years since last A1c screening test - Would like repeat test  History of anal fissure - Reports prior dx anal fissure after some rectal bleeding, it resolved and never tried anusol suppository, now has some recurrence - Admits occasional constipation - Denies abdominal pain, diarrhea, dark stools  Social History  Substance Use Topics  . Smoking status: Never Smoker  . Smokeless tobacco: Never Used  . Alcohol use No    Review of Systems Per HPI unless specifically indicated above     Objective:    BP 122/77   Pulse 92   Temp 97.4 F (36.3 C) (Oral)   Resp 16   Ht 5\' 7"  (1.702 m)   Wt 272 lb 3.2 oz (123.5 kg)   BMI 42.63 kg/m   Wt Readings from Last 3 Encounters:  05/02/16 272 lb 3.2 oz (123.5 kg)  01/05/16 263 lb (119.3 kg)  11/21/15 259 lb (117.5 kg)    Physical Exam  Constitutional: He appears well-developed and well-nourished. No distress.  Well-appearing, comfortable, cooperative, overweight  appearance, but some increased muscle mass  Cardiovascular: Normal rate, regular rhythm, normal heart sounds and intact distal pulses.   No murmur heard. Pulmonary/Chest: Effort normal and breath sounds normal. No respiratory distress. He has no wheezes. He has no rales.  Musculoskeletal: He exhibits no edema.  Neurological: He is alert.  Skin: Skin is warm and dry. He is not diaphoretic.  Nursing note and vitals reviewed.  Results for orders placed or performed in visit on 11/21/15  POCT rapid strep A  Result Value Ref Range   Rapid Strep A Screen Negative Negative      Assessment & Plan:   Problem List Items Addressed This Visit    Morbid obesity with BMI of 40.0-44.9, adult (HCC)    Recent weight gain in 4 months, less active, some poor lifestyle - Check future labs, including A1c DM screening, lipids - Follow-up results      Relevant Orders   Hemoglobin A1c   Lipid panel   Hypertension - Primary    Well controlled HTN on 3 drug regimen, outside BP seems comparable No known complication  Plan: 1. Continue Losartan-HCTZ 100-25mg , and Amlodipine 2.5mg  daily 2. Encouraged improve lifestyle mods with regular exercise (some limited by knee pain), low sodium diet 3. Check future chemistry, screening A1c, [facility] labs and then follow-up 6 mo for follow-up BP      Relevant Orders   Comprehensive metabolic panel   Hemoglobin A1c   Hyperlipidemia    Abnormal TG >170,  low HDL 26, normal LDL 75 - Not on cholesterol med or statin therapy  Plan: 1. Check future fasting lipids, follow-up results, calculate ASCVD risk, consider statin 2. Today recommend trial OTC Fish Oil up to 1-2g BID for hyperTG, improve lifestyle and exercise for HDL 3. Follow-up 6 months      Relevant Orders   Lipid panel   Anal fissure    By history, did not examine today, mild recurrence, previously resolved without therapy - Recommend trial topical preparation H, fiber diet, hydration, consider  metamucil for fiber or miralax if constipated - Follow-up sooner if needed, consider Diltiazem topical rx       Other Visit Diagnoses    Screening for diabetes mellitus       Relevant Orders   Hemoglobin A1c      No orders of the defined types were placed in this encounter.     Follow up plan: Return in about 6 months (around 10/31/2016) for blood pressure, cholesterol.  Saralyn PilarAlexander Karamalegos, DO [facility] 05/02/2016, 11:12 PM

## 2016-05-02 NOTE — Assessment & Plan Note (Signed)
Recent weight gain in 4 months, less active, some poor lifestyle - Check future labs, including A1c DM screening, lipids - Follow-up results

## 2016-05-04 DIAGNOSIS — Z6841 Body Mass Index (BMI) 40.0 and over, adult: Secondary | ICD-10-CM | POA: Diagnosis not present

## 2016-05-04 DIAGNOSIS — Z131 Encounter for screening for diabetes mellitus: Secondary | ICD-10-CM | POA: Diagnosis not present

## 2016-05-04 DIAGNOSIS — I10 Essential (primary) hypertension: Secondary | ICD-10-CM | POA: Diagnosis not present

## 2016-05-04 DIAGNOSIS — E782 Mixed hyperlipidemia: Secondary | ICD-10-CM | POA: Diagnosis not present

## 2016-05-05 LAB — COMPREHENSIVE METABOLIC PANEL
ALBUMIN: 4.4 g/dL (ref 3.5–5.5)
ALK PHOS: 81 IU/L (ref 39–117)
ALT: 36 IU/L (ref 0–44)
AST: 29 IU/L (ref 0–40)
Albumin/Globulin Ratio: 1.3 (ref 1.2–2.2)
BUN / CREAT RATIO: 12 (ref 9–20)
BUN: 12 mg/dL (ref 6–24)
Bilirubin Total: 0.4 mg/dL (ref 0.0–1.2)
CALCIUM: 9.6 mg/dL (ref 8.7–10.2)
CO2: 24 mmol/L (ref 18–29)
CREATININE: 1.02 mg/dL (ref 0.76–1.27)
Chloride: 100 mmol/L (ref 96–106)
GFR calc Af Amer: 100 mL/min/{1.73_m2} (ref >59)
GFR, EST NON AFRICAN AMERICAN: 87 mL/min/{1.73_m2} (ref >59)
GLOBULIN, TOTAL: 3.5 g/dL (ref 1.5–4.5)
Glucose: 93 mg/dL (ref 65–99)
Potassium: 4.5 mmol/L (ref 3.5–5.2)
SODIUM: 141 mmol/L (ref 134–144)
Total Protein: 7.9 g/dL (ref 6.0–8.5)

## 2016-05-05 LAB — HEMOGLOBIN A1C
Est. average glucose Bld gHb Est-mCnc: 128 mg/dL
Hgb A1c MFr Bld: 6.1 % — ABNORMAL HIGH (ref 4.8–5.6)

## 2016-05-05 LAB — LIPID PANEL
CHOL/HDL RATIO: 4.5 ratio (ref 0.0–5.0)
Cholesterol, Total: 145 mg/dL (ref 100–199)
HDL: 32 mg/dL — AB (ref >39)
LDL Calculated: 64 mg/dL (ref 0–99)
Triglycerides: 243 mg/dL — ABNORMAL HIGH (ref 0–149)
VLDL CHOLESTEROL CAL: 49 mg/dL — AB (ref 5–40)

## 2016-05-08 ENCOUNTER — Encounter: Payer: Self-pay | Admitting: Family Medicine

## 2016-05-08 DIAGNOSIS — R7303 Prediabetes: Secondary | ICD-10-CM | POA: Insufficient documentation

## 2016-05-08 NOTE — Addendum Note (Signed)
Addended by: Smitty CordsKARAMALEGOS, ALEXANDER J on: 05/08/2016 05:37 PM   Modules accepted: Orders

## 2016-05-29 ENCOUNTER — Telehealth: Payer: Self-pay | Admitting: Family Medicine

## 2016-05-29 NOTE — Telephone Encounter (Signed)
Last visit with me on 05/02/16 for variety of medical issues, including Anal Fissures, which I had advised several treatments for, however I do not recall specifically discussing colonoscopy plans. He is currently age 51 and is under the recommended age 49 for screening colonoscopy, therefore this would be a diagnostic colonoscopy. If the problem he wants addressed are the anal fissures, then there are other options first we would explore before colonoscopy, or possibly refer him to General Surgery who often will manage anal fissures, hemorrhoids and they do perform some colonoscopy if indicated in future.  Need more information. Attempted to call patient back, but could not reach. Left voicemail for him to call back.  Saralyn PilarAlexander Rad Gramling, DO [facility] 05/29/2016, 4:16 PM

## 2016-05-29 NOTE — Telephone Encounter (Signed)
Pt needs referral for colonoscopy for on going issue.  His call back number is [phone]

## 2016-11-17 DIAGNOSIS — M222X1 Patellofemoral disorders, right knee: Secondary | ICD-10-CM | POA: Diagnosis not present

## 2016-12-20 ENCOUNTER — Ambulatory Visit: Payer: BLUE CROSS/BLUE SHIELD | Admitting: Family Medicine

## 2016-12-21 ENCOUNTER — Encounter: Payer: Self-pay | Admitting: Family Medicine

## 2016-12-21 ENCOUNTER — Ambulatory Visit (INDEPENDENT_AMBULATORY_CARE_PROVIDER_SITE_OTHER): Payer: BLUE CROSS/BLUE SHIELD | Admitting: Family Medicine

## 2016-12-21 VITALS — BP 131/89 | HR 90 | Temp 98.2°F | Resp 16 | Ht 67.0 in | Wt 269.0 lb

## 2016-12-21 DIAGNOSIS — M25461 Effusion, right knee: Secondary | ICD-10-CM

## 2016-12-21 DIAGNOSIS — M25561 Pain in right knee: Secondary | ICD-10-CM

## 2016-12-21 DIAGNOSIS — M1990 Unspecified osteoarthritis, unspecified site: Secondary | ICD-10-CM | POA: Diagnosis not present

## 2016-12-21 MED ORDER — CYCLOBENZAPRINE HCL 10 MG PO TABS: 5.0000 mg | ORAL_TABLET | Freq: Three times a day (TID) | ORAL | 0 refills | Status: DC | PRN

## 2016-12-21 NOTE — Progress Notes (Signed)
Subjective:    Patient ID: Richard Wheeler, male    DOB: 14-Mar-1966, 51 y.o.   MRN: [id number]  Richard Wheeler is a 51 y.o. male presenting on 12/21/2016 for Knee Pain (onset 07/04 both side)   HPI   Right Knee Pain, Subacute on Chronic: - Reviewed recent course of R knee pain with initial symptoms starting on 11/07/16 after run, without any obvious known injury or trauma. He had increased his exercise around that time, but continued to play basketball as usual. He was seen by [facility] on 11/17/16, reviewed record dx with PFPS given a hinged R knee brace, Naproxen, and referral to Ortho, however patient states he was never referred and never heard back about apt. - Today he is following up on same issue since unresolved. He tried Naproxen 500mg  BID for >2 weeks with only mild relief, now off, tried ice, knee brace, some pain worse at night, worse with inc activity and prolonged ambulation or prolonged seated with stiffness in knee. Mostly constant pain 4/10 average aching, worse up to 7/10 if provoked pain. - No prior R knee injury, no recent x-rays, not established with Ortho - Denies any other joint pain or swelling, injury or trauma or fall, fever/chills  Social History  Substance Use Topics  . Smoking status: Never Smoker  . Smokeless tobacco: Never Used  . Alcohol use No    Review of Systems Per HPI unless specifically indicated above     Objective:    BP 131/89   Pulse 90   Temp 98.2 F (36.8 C) (Oral)   Resp 16   Ht 5\' 7"  (1.702 m)   Wt 269 lb (122 kg)   BMI 42.13 kg/m   Wt Readings from Last 3 Encounters:  12/21/16 269 lb (122 kg)  05/02/16 272 lb 3.2 oz (123.5 kg)  01/05/16 263 lb (119.3 kg)    Physical Exam  Constitutional: He is oriented to person, place, and time. He appears well-developed and well-nourished. No distress.  Well-appearing, comfortable, cooperative  HENT:  Head: Normocephalic and atraumatic.  Mouth/Throat: Oropharynx is clear  and moist.  Eyes: Conjunctivae are normal. Right eye exhibits no discharge. Left eye exhibits no discharge.  Neck: Normal range of motion. Neck supple. No thyromegaly present.  Cardiovascular: Normal rate, regular rhythm, normal heart sounds and intact distal pulses.   No murmur heard. Pulmonary/Chest: Effort normal and breath sounds normal. No respiratory distress. He has no wheezes. He has no rales.  Musculoskeletal: Normal range of motion. He exhibits no edema.  Right Knee Inspection: Slightly bulky appearance compared to L. No ecchymosis. Mild effusion more noticeable in supine Palpation: Mild +TTP R knee only medial joint line. No crepitus ROM: mostly full AROM some slight limited extension and flexion due to pain/effusion Special Testing: Lachman / Valgus/Varus tests negative with intact ligaments (ACL, MCL, LCL). McMurray positive for discomfort mild pain with medial meniscus testing. Standing Thessaly test mild pain medial meniscus.  Strength: 5/5 intact knee flex/ext, ankle dorsi/plantarflex Neurovascular: distally intact sensation light touch and pulses  Lymphadenopathy:    He has no cervical adenopathy.  Neurological: He is alert and oriented to person, place, and time.  Skin: Skin is warm and dry. No rash noted. He is not diaphoretic. No erythema.  Psychiatric: He has a normal mood and affect. His behavior is normal.  Well groomed, good eye contact, normal speech and thoughts  Nursing note and vitals reviewed.     Results for orders placed  or performed in visit on 05/02/16  Comprehensive metabolic panel  Result Value Ref Range   Glucose 93 65 - 99 mg/dL   BUN 12 6 - 24 mg/dL   Creatinine, Ser 1.61 0.76 - 1.27 mg/dL   GFR calc non Af Amer 87 >59 mL/min/1.73   GFR calc Af Amer 100 >59 mL/min/1.73   BUN/Creatinine Ratio 12 9 - 20   Sodium 141 134 - 144 mmol/L   Potassium 4.5 3.5 - 5.2 mmol/L   Chloride 100 96 - 106 mmol/L   CO2 24 18 - 29 mmol/L   Calcium 9.6 8.7 - 10.2  mg/dL   Total Protein 7.9 6.0 - 8.5 g/dL   Albumin 4.4 3.5 - 5.5 g/dL   Globulin, Total 3.5 1.5 - 4.5 g/dL   Albumin/Globulin Ratio 1.3 1.2 - 2.2   Bilirubin Total 0.4 0.0 - 1.2 mg/dL   Alkaline Phosphatase 81 39 - 117 IU/L   AST 29 0 - 40 IU/L   ALT 36 0 - 44 IU/L  Hemoglobin A1c  Result Value Ref Range   Hgb A1c MFr Bld 6.1 (H) 4.8 - 5.6 %   Est. average glucose Bld gHb Est-mCnc 128 mg/dL  Lipid panel  Result Value Ref Range   Cholesterol, Total 145 100 - 199 mg/dL   Triglycerides 096 (H) 0 - 149 mg/dL   HDL 32 (L) >04 mg/dL   VLDL Cholesterol Cal 49 (H) 5 - 40 mg/dL   LDL Calculated 64 0 - 99 mg/dL   Chol/HDL Ratio 4.5 0.0 - 5.0 ratio units      Assessment & Plan:   Problem List Items Addressed This Visit    Arthritis    History of OA/DJD in other joints including knees Likely underlying factor for R knee pain      Relevant Medications   cyclobenzaprine (FLEXERIL) 10 MG tablet   Other Relevant Orders   DG Knee Complete 4 Views Right    Other Visit Diagnoses    Pain and swelling of right knee    -  Primary  Subacute new R medial and swelling without known injury or trauma. Concern medial meniscus injury based on exam, more likely chronic degenerative without obvious mechanism acute tear. - Suspected likely due to underlying osteoarthritis / DJD with known OA/DJD in other joints. - Able to bear weight, no knee instability or mechanical locking - Prior L knee meniscus injury, arthroscopic surgery - Not fully responding to conservative therapy  Plan: 1. Referral to [facility] - unsure why prior referral was never processed 2. Restart PRN Naproxen 3. Start Tylenol 500-1000mg  per dose TID PRN breakthrough - Start muscle relaxant with Flexeril 10mg  tabs - take 5-10mg  up to TID PRN, titrate up as tolerated 4. RICE therapy (rest, ice, compression, elevation) for swelling, activity modification 5. Knee x-rays ordered to eval for underlying OA/DJD, and eval knee  in general given >6 weeks symptoms, no X-ray in office today on Friday, will return early next week 6. Follow-up 4 weeks for physical can get update, likely may need PT vs injection vs advanced imaging if still concern meniscus at that time    Relevant Medications   cyclobenzaprine (FLEXERIL) 10 MG tablet   Other Relevant Orders   DG Knee Complete 4 Views Right   Ambulatory referral to Orthopedic Surgery      Meds ordered this encounter  Medications  . DISCONTD: HYDROcodone-acetaminophen (NORCO/VICODIN) 5-325 MG tablet    Sig: Take one tablet at night for severe  pain. May also use up to every 6 hours as needed, if not working or driving.  Marland Kitchen DISCONTD: naproxen (NAPROSYN) 500 MG tablet    Refill:  0  . cyclobenzaprine (FLEXERIL) 10 MG tablet    Sig: Take 0.5-1 tablets (5-10 mg total) by mouth 3 (three) times daily as needed for muscle spasms.    Dispense:  30 tablet    Refill:  0      Follow up plan: Return in about 4 weeks (around 01/18/2017) for Annual Physical (R knee pain).  Saralyn Pilar, DO [facility] 12/23/2016, 8:14 PM

## 2016-12-21 NOTE — Patient Instructions (Addendum)
Thank you for coming to the clinic today.  1. I am concerned for possible medial meniscus injury of Right knee, could be partial tear or inflammation, otherwise likely some component of knee sprain.  Also could Patellofemoral Pain Syndrome (PFPS), this is related to loss of cartilage and wear and tear behind your knee cap.  - Possible to have degenerative arthritis in the bones of your knee as well  - X-rays ordered you can schedule this next week or walk in anytime, I will send results to MyChart with information once we receive it, likely within 24 hours  May try OTC Aleve again 2 of the 250mg  tablets twice daily with food as needed  Otherwise start the flexeril muscle relaxant  Start Cyclobenzapine (Flexeril) 10mg  tablets (muscle relaxant) - start with half (cut) to one whole pill at night for muscle relaxant - may make you sedated or sleepy (be careful driving or working on this) if tolerated you can take half to whole tab 2 to 3 times daily or every 8 hours as needed  Recommend to start taking Tylenol Extra Strength 500mg  tabs - take 1 to 2 tabs per dose (max 1000mg ) every 6-8 hours for pain (take regularly, don't skip a dose for next 7 days), max 24 hour daily dose is 6 tablets or 3000mg . In the future you can repeat the same everyday Tylenol course for 1-2 weeks at a time.  Use RICE therapy: - R - Rest / relative rest with activity modification avoid overuse and frequent bending or pressure on bent knee - I - Ice packs (make sure you use a towel or sock / something to protect skin) - C - Compression with flexible Knee Sleeve to apply pressure and reduce swelling allowing more support - E - Elevation - if significant swelling, lift leg above heart level (toes above your nose) to help reduce swelling, most helpful at night after day of being on your feet  Referral was placed to Ortho - stay tuned for an appointment call if you have not heard yet  [facility] [address] [place], [place]  [address] Phone: [phone]  Please schedule a Follow-up Appointment to: Return in about 4 weeks (around 01/18/2017) for Annual Physical (R knee pain).  If you have any other questions or concerns, please feel free to call the clinic or send a message through MyChart. You may also schedule an earlier appointment if necessary.  Additionally, you may be receiving a survey about your experience at our clinic within a few days to 1 week by e-mail or mail. We value your feedback.  Saralyn Pilar, DO [facility], [place]

## 2016-12-23 NOTE — Assessment & Plan Note (Signed)
History of OA/DJD in other joints including knees Likely underlying factor for R knee pain

## 2016-12-24 DIAGNOSIS — M25561 Pain in right knee: Secondary | ICD-10-CM | POA: Diagnosis not present

## 2016-12-27 DIAGNOSIS — M25561 Pain in right knee: Secondary | ICD-10-CM | POA: Diagnosis not present

## 2016-12-27 DIAGNOSIS — G8929 Other chronic pain: Secondary | ICD-10-CM | POA: Diagnosis not present

## 2016-12-27 DIAGNOSIS — M1711 Unilateral primary osteoarthritis, right knee: Secondary | ICD-10-CM | POA: Diagnosis not present

## 2016-12-27 DIAGNOSIS — M2391 Unspecified internal derangement of right knee: Secondary | ICD-10-CM | POA: Diagnosis not present

## 2017-01-01 ENCOUNTER — Encounter: Payer: BLUE CROSS/BLUE SHIELD | Admitting: Family Medicine

## 2017-01-16 ENCOUNTER — Telehealth: Payer: Self-pay | Admitting: Family Medicine

## 2017-01-16 DIAGNOSIS — I10 Essential (primary) hypertension: Secondary | ICD-10-CM

## 2017-01-16 MED ORDER — LOSARTAN POTASSIUM-HCTZ 100-25 MG PO TABS: 1.0000 | ORAL_TABLET | Freq: Every day | ORAL | 12 refills | Status: DC

## 2017-01-16 MED ORDER — AMLODIPINE BESYLATE 2.5 MG PO TABS: 2.5000 mg | ORAL_TABLET | Freq: Every day | ORAL | 3 refills | Status: DC

## 2017-01-16 NOTE — Telephone Encounter (Signed)
Pt needs refills on losartan and amlodipine sent to [facility] [address]

## 2017-01-16 NOTE — Telephone Encounter (Signed)
Refills both meds sent to [facility] [address]  Saralyn PilarAlexander Karamalegos, DO [facility] 01/16/2017, 12:30 PM

## 2017-01-22 ENCOUNTER — Encounter: Payer: BLUE CROSS/BLUE SHIELD | Admitting: Family Medicine

## 2017-01-24 ENCOUNTER — Encounter: Payer: BLUE CROSS/BLUE SHIELD | Admitting: Family Medicine

## 2017-02-12 ENCOUNTER — Encounter: Payer: Self-pay | Admitting: Family Medicine

## 2017-02-12 ENCOUNTER — Ambulatory Visit (INDEPENDENT_AMBULATORY_CARE_PROVIDER_SITE_OTHER): Payer: BLUE CROSS/BLUE SHIELD | Admitting: Family Medicine

## 2017-02-12 ENCOUNTER — Other Ambulatory Visit: Payer: Self-pay | Admitting: Orthopedic Surgery

## 2017-02-12 VITALS — BP 129/81 | HR 93 | Temp 98.9°F | Resp 16 | Ht 67.0 in | Wt 267.0 lb

## 2017-02-12 DIAGNOSIS — K5900 Constipation, unspecified: Secondary | ICD-10-CM | POA: Diagnosis not present

## 2017-02-12 DIAGNOSIS — E782 Mixed hyperlipidemia: Secondary | ICD-10-CM

## 2017-02-12 DIAGNOSIS — R7303 Prediabetes: Secondary | ICD-10-CM

## 2017-02-12 DIAGNOSIS — M25561 Pain in right knee: Principal | ICD-10-CM

## 2017-02-12 DIAGNOSIS — Z125 Encounter for screening for malignant neoplasm of prostate: Secondary | ICD-10-CM

## 2017-02-12 DIAGNOSIS — N401 Enlarged prostate with lower urinary tract symptoms: Secondary | ICD-10-CM | POA: Diagnosis not present

## 2017-02-12 DIAGNOSIS — Z6841 Body Mass Index (BMI) 40.0 and over, adult: Secondary | ICD-10-CM | POA: Diagnosis not present

## 2017-02-12 DIAGNOSIS — Z23 Encounter for immunization: Secondary | ICD-10-CM

## 2017-02-12 DIAGNOSIS — R3912 Poor urinary stream: Secondary | ICD-10-CM

## 2017-02-12 DIAGNOSIS — M1711 Unilateral primary osteoarthritis, right knee: Secondary | ICD-10-CM | POA: Diagnosis not present

## 2017-02-12 DIAGNOSIS — G8929 Other chronic pain: Secondary | ICD-10-CM

## 2017-02-12 DIAGNOSIS — M2391 Unspecified internal derangement of right knee: Secondary | ICD-10-CM | POA: Diagnosis not present

## 2017-02-12 DIAGNOSIS — I10 Essential (primary) hypertension: Secondary | ICD-10-CM

## 2017-02-12 DIAGNOSIS — Z0000 Encounter for general adult medical examination without abnormal findings: Secondary | ICD-10-CM

## 2017-02-12 LAB — POCT GLYCOSYLATED HEMOGLOBIN (HGB A1C): HEMOGLOBIN A1C: 5.6 (ref ?–5.7)

## 2017-02-12 MED ORDER — POLYETHYLENE GLYCOL 3350 17 GM/SCOOP PO POWD: 17.0000 g | Freq: Every day | ORAL | 1 refills | Status: DC | PRN

## 2017-02-12 MED ORDER — SAW PALMETTO (SERENOA REPENS) 160 MG PO CAPS: 160.0000 mg | ORAL_CAPSULE | Freq: Two times a day (BID) | ORAL | Status: DC

## 2017-02-12 NOTE — Assessment & Plan Note (Signed)
Well-controlled Pre-DM with A1c down to 5.6, significant improvement from prior 6.1 Concern with obesity, HTN, HLD  Plan:  1. Not on any therapy currently  2. Encourage improved lifestyle - low carb, low sugar diet, reduce portion size, continue improving regular exercise in future as tolerated by knee 3. Follow-up q 1 year for A1c, [facility]

## 2017-02-12 NOTE — Assessment & Plan Note (Signed)
Consistent clinically with new diagnosis BPH, with lower urinary tract symptoms (LUTS) - AUA BPH score 11 (no prior) - Never on meds - Last PSA never (ordered today) - Last DRE 1 year ago reported mild enlarged - No known personal/family history of prostate CA  Plan: 1. Start Saw Palmetto OTC  BID 2. Follow-up as needed if not improved consider rx Flomax

## 2017-02-12 NOTE — Assessment & Plan Note (Signed)
Well controlled HTN Home reading: reportedly normal No known complication  Plan: 1. Continue Losartan-HCTZ 100-25mg , and Amlodipine 2.5mg  daily 2. Encouraged improve lifestyle mods with diet and in future can improve regular exercise (as tolerated by knee) 3. Check future labs at [facility] 4. Monitor BP outside office 5. Follow-up q 1 year for annual - sooner if BP elevated outside office

## 2017-02-12 NOTE — Assessment & Plan Note (Signed)
Previously uncontrolled cholesterol on with limited exercise due to knee Last lipid panel 04/2016 Calculated ASCVD 10 yr risk score 7.6%  Plan: 1. Future would recommend Fish Oil - discussed that based on previous results did not recommend statin at this time, however now due for repeat lipids - will check and calculate ASCVD risk 2. Consider ASA  for primary ASCVD risk reduction 3. Encourage improved lifestyle - low carb/cholesterol, reduce portion size, continue improving regular exercise as tolerated with knee 4. Follow-up 1 year annual

## 2017-02-12 NOTE — Assessment & Plan Note (Signed)
Gradual wt loss trend with improved diet, still limited exercise Due for repeat labs Encouraged continue improve lifestyle

## 2017-02-12 NOTE — Assessment & Plan Note (Signed)
Avg / Low risk patient, with prior reported negative screening - Last PSA: never had, age 51 - Last DRE 1 year ago, reported mild enlarged - Clinically with some LUTS  Plan: 1. Reviewed prostate cancer screening guidelines and risks including potential prostate biopsy if abnormal PSA, proceed with yearly PSA for now, and anticipate DRE as needed especially if abnormal PSA or new symptoms - Discussed that routine age 81 for initiating screening, he agrees to test now since he will be >52 at next year annual, and already with symptoms since treating with meds now as well for BPH

## 2017-02-12 NOTE — Patient Instructions (Addendum)
Thank you for coming to the clinic today.  1. A1c 5.6, much improved from previous 6.1, still near borderline pre-diabetes, but below range (5.7 to 6.4)  For constipation  Recommend trying Miralax OTC 17g = 1 capful in large glass water once daily for now, try several days to see if working, goal is soft stool or BM 1-2 times daily, if too loose then reduce dose or try every other day. If not effective may need to increase it to 2 doses at once in AM or may repeat 2nd dose in afternoon/evening.  - This medicine is good for as needed situations or even maintenance therapy for longer term for several days to weeks at a time to help regulate bowel movements  Other more natural remedies or preventative treatment can be increase hydration with water to help more regular bowels, or can increase fiber in diet, either with increased high fiber foods (vegetables, leafy greens etc) or can take daily fiber supplement over the counter.  2. You most likely have BPH (mildly enlarged prostate), this can affect your urinary symptoms  Recommend starting OTC Saw Palmetto capsules  take one twice daily, try for few weeks to few months to see if urinary symptoms are improve.  AUA BPH Symptom Score over past 1 month 1. Sensation of not emptying bladder post void - 2 2. Urinate less than 2 hour after finish last void - 1 3. Start/Stop several times during void - 1 (morning only) 4. Difficult to postpone urination - 3 5. Weak urinary stream - 4 6. Push or strain urination - 0 7. Nocturia - 0 times  Total Score: 11 (Moderate BPH symptoms)  3. DUE for FASTING BLOOD WORK (no food or drink after midnight before the lab appointment, only water or coffee without cream/sugar on the morning of)  [facility]  For Lab Results, once available within 2-3 days of blood draw, you can can log in to MyChart online to view your results and a brief explanation. Also, we can discuss results at next follow-up  visit.  Please schedule a Follow-up Appointment to: Return in about 1 year (around 02/12/2018) for Annual Physical.  If you have any other questions or concerns, please feel free to call the clinic or send a message through MyChart. You may also schedule an earlier appointment if necessary.  Additionally, you may be receiving a survey about your experience at our clinic within a few days to 1 week by e-mail or mail. We value your feedback.  Saralyn Pilar, DO [facility]   High-Fiber Diet Fiber, also called dietary fiber, is a type of carbohydrate found in fruits, vegetables, whole grains, and beans. A high-fiber diet can have many health benefits. Your health care provider may recommend a high-fiber diet to help:  Prevent constipation. Fiber can make your bowel movements more regular.  Lower your cholesterol.  Relieve hemorrhoids, uncomplicated diverticulosis, or irritable bowel syndrome.  Prevent overeating as part of a weight-loss plan.  Prevent heart disease, type 2 diabetes, and certain cancers.  What is my plan? The recommended daily intake of fiber includes:  38 grams for men under age 26.  30 grams for men over age 51.  25 grams for women under age 22.  21 grams for women over age 45.  You can get the recommended daily intake of dietary fiber by eating a variety of fruits, vegetables, grains, and beans. Your health care provider may also recommend a fiber supplement if it is  not possible to get enough fiber through your diet. What do I need to know about a high-fiber diet?  Fiber supplements have not been widely studied for their effectiveness, so it is better to get fiber through food sources.  Always check the fiber content on thenutrition facts label of any prepackaged food. Look for foods that contain at least 5 grams of fiber per serving.  Ask your dietitian if you have questions about specific foods that are related to your  condition, especially if those foods are not listed in the following section.  Increase your daily fiber consumption gradually. Increasing your intake of dietary fiber too quickly may cause bloating, cramping, or gas.  Drink plenty of water. Water helps you to digest fiber. What foods can I eat? Grains Whole-grain breads. Multigrain cereal. Oats and oatmeal. Brown rice. Barley. Bulgur wheat. Millet. Bran muffins. Popcorn. Rye wafer crackers. Vegetables Sweet potatoes. Spinach. Kale. Artichokes. Cabbage. Broccoli. Green peas. Carrots. Squash. Fruits Berries. Pears. Apples. Oranges. Avocados. Prunes and raisins. Dried figs. Meats and Other Protein Sources Navy, kidney, pinto, and soy beans. Split peas. Lentils. Nuts and seeds. Dairy Fiber-fortified yogurt. Beverages Fiber-fortified soy milk. Fiber-fortified orange juice. Other Fiber bars. The items listed above may not be a complete list of recommended foods or beverages. Contact your dietitian for more options. What foods are not recommended? Grains White bread. Pasta made with refined flour. White rice. Vegetables Fried potatoes. Canned vegetables. Well-cooked vegetables. Fruits Fruit juice. Cooked, strained fruit. Meats and Other Protein Sources Fatty cuts of meat. Fried Environmental education officer or fried fish. Dairy Milk. Yogurt. Cream cheese. Sour cream. Beverages Soft drinks. Other Cakes and pastries. Butter and oils. The items listed above may not be a complete list of foods and beverages to avoid. Contact your dietitian for more information. What are some tips for including high-fiber foods in my diet?  Eat a wide variety of high-fiber foods.  Make sure that half of all grains consumed each day are whole grains.  Replace breads and cereals made from refined flour or white flour with whole-grain breads and cereals.  Replace white rice with brown rice, bulgur wheat, or millet.  Start the day with a breakfast that is high in fiber,  such as a cereal that contains at least 5 grams of fiber per serving.  Use beans in place of meat in soups, salads, or pasta.  Eat high-fiber snacks, such as berries, raw vegetables, nuts, or popcorn. This information is not intended to replace advice given to you by your health care provider. Make sure you discuss any questions you have with your health care provider. Document Released: 04/23/2005 Document Revised: 09/29/2015 Document Reviewed: 10/06/2013 Elsevier Interactive Patient Education  2017 [employer].

## 2017-02-12 NOTE — Assessment & Plan Note (Signed)
Consistent with more functional constipation based on chronic history and intermittent episodes.  Plan: 1. Start Miralax OTC titration instructions given 2. Improve hydration, inc fiber in diet, handouts given 3. Follow-up if not improved

## 2017-02-12 NOTE — Progress Notes (Signed)
Subjective:    Patient ID: Richard Wheeler, male    DOB: 02/11/1966, 51 y.o.   MRN: [id number]  Richard Wheeler is a 51 y.o. male presenting on 02/12/2017 for Annual Exam   HPI   Here for Annual Exam. He gets labs done through [facility] and will go there this week after receives orders from today.  CHRONIC HTN: No new concerns. BP checking at home, 120-130s / 80-85 Current meds: Losartan-HCTZ 100-25mg  daily, Amlodipine 2.5mg  Reports good compliance, took meds today. Tolerating well, w/o complaints.  HYPERLIPIDEMIA: - Reports prior known history of HyperTG, due for upcoming fasting lipids. Last checked 04/2016 - Did not start Fish Oil  Pre-Diabetes / Morbid Obesity BMI >41 - Last A1c 6.1. He has tried to improve lifestyle. No significant fam history DM Lifestyle: - Weight down 5 lbs in 10 months - Diet: improving healthy diet, limit portions, low salt - Exercise: Limited due to knee pain, followed by [facility], had injection now anticipating MRI  Constipation - Reports history of prior anal fissure vs hemorrhoid in past with BRBPR small amount, this has resolved now for few months - Intermittent episodes of constipation still, episodes last several days, BM every 1-2 days usually at that time, harder stool, smaller pellets, straining. - Not tried medicines  Presumed new dx BPH with LUTS History of some lower urinary tract symptoms, has seen prior PCP and Urology for other issues (last seen >1-2 years ago). Never on rx medication, offered OTC therapy but never started. - Today describes some urinary symptoms, see score below  AUA BPH Symptom Score over past 1 month 1. Sensation of not emptying bladder post void - 2 2. Urinate less than 2 hour after finish last void - 1 3. Start/Stop several times during void - 1 (morning only) 4. Difficult to postpone urination - 3 5. Weak urinary stream - 4 6. Push or strain urination - 0 7. Nocturia - 0 times  Total Score: 11 (Moderate BPH  symptoms)  Health Maintenance: - Due for Flu Shot, will receive today - Prostate CA Screening: Prior DRE reported mildly enlarged or "swollen" prostate last year 2017. No known prior PSA. Currently with some moderate LUTS, see score above. No known family history of prostate CA. Agreeable to early prostate CA screening now at age 46, nearly 42  Depression screen PHQ 2/9 02/12/2017 01/05/2016 10/24/2015  Decreased Interest 0 0 0  Down, Depressed, Hopeless 0 0 0  PHQ - 2 Score 0 0 0    Past Medical History:  Diagnosis Date  . Hypertension   . Infected cuticle 03/08/2015   Left Hand 4th digit   . Obesity    Past Surgical History:  Procedure Laterality Date  . left knee surgery     Social History   Social History  . Marital status: Married    Spouse name: N/A  . Number of children: N/A  . Years of education: N/A   Occupational History  . Not on file.   Social History Main Topics  . Smoking status: Never Smoker  . Smokeless tobacco: Never Used  . Alcohol use No  . Drug use: No  . Sexual activity: Not on file   Other Topics Concern  . Not on file   Social History Narrative  . No narrative on file   Family History  Problem Relation Age of Onset  . Cancer Mother 70  . Prostate cancer Neg Hx   . Bladder Cancer Neg Hx   .  Kidney cancer Neg Hx    Current Outpatient Prescriptions on File Prior to Visit  Medication Sig  . amLODipine (NORVASC) 2.5 MG tablet Take 1 tablet (2.5 mg total) by mouth daily.  Marland Kitchen losartan-hydrochlorothiazide (HYZAAR) 100-25 MG tablet Take 1 tablet by mouth daily.  . Multiple Vitamin (MULTIVITAMIN WITH MINERALS) TABS tablet Take 1 tablet by mouth daily.   No current facility-administered medications on file prior to visit.     Review of Systems  Constitutional: Negative for activity change, appetite change, chills, diaphoresis, fatigue, fever and unexpected weight change.  HENT: Negative for congestion, hearing loss and sinus pressure.   Eyes:  Negative for visual disturbance.  Respiratory: Negative for apnea, cough, choking, chest tightness, shortness of breath and wheezing.   Cardiovascular: Negative for chest pain, palpitations and leg swelling.  Gastrointestinal: Positive for constipation. Negative for abdominal pain, anal bleeding, blood in stool, diarrhea, nausea and vomiting.  Endocrine: Negative for cold intolerance and polyuria.  Genitourinary: Positive for difficulty urinating. Negative for decreased urine volume, dysuria, frequency, hematuria, scrotal swelling, testicular pain and urgency.       Weak stream  Musculoskeletal: Negative for arthralgias, back pain and neck pain.  Skin: Negative for rash.  Allergic/Immunologic: Negative for environmental allergies.  Neurological: Negative for dizziness, weakness, light-headedness, numbness and headaches.  Hematological: Negative for adenopathy.  Psychiatric/Behavioral: Negative for behavioral problems, dysphoric mood and sleep disturbance. The patient is not nervous/anxious.    Per HPI unless specifically indicated above     Objective:    BP 129/81   Pulse 93   Temp 98.9 F (37.2 C) (Oral)   Resp 16   Ht  (1.702 m)   Wt 267 lb (121.1 kg)   BMI 41.82 kg/m   Wt Readings from Last 3 Encounters:  02/12/17 267 lb (121.1 kg)  12/21/16 269 lb (122 kg)  05/02/16 272 lb 3.2 oz (123.5 kg)    Physical Exam  Constitutional: He is oriented to person, place, and time. He appears well-developed and well-nourished. No distress.  Well-appearing, comfortable, cooperative, obese  HENT:  Head: Normocephalic and atraumatic.  Mouth/Throat: Oropharynx is clear and moist.  Frontal / maxillary sinuses non-tender. Nares patent without purulence or edema. Bilateral TMs clear without erythema, effusion or bulging. Oropharynx clear without erythema, exudates, edema or asymmetry.  Mallampati Score 4 - Soft palate is not visible at all  Eyes: Pupils are equal, round, and reactive to  light. Conjunctivae and EOM are normal. Right eye exhibits no discharge. Left eye exhibits no discharge.  Neck: Normal range of motion. Neck supple. No thyromegaly present.  Cardiovascular: Normal rate, regular rhythm, normal heart sounds and intact distal pulses.   No murmur heard. Pulmonary/Chest: Effort normal and breath sounds normal. No respiratory distress. He has no wheezes. He has no rales.  Abdominal: Soft. Bowel sounds are normal. He exhibits no distension and no mass. There is no tenderness.  Genitourinary:  Genitourinary Comments: Deferred DRE today  Musculoskeletal: Normal range of motion. He exhibits no edema or tenderness.  Upper / Lower Extremities: - Normal muscle tone, strength bilateral upper extremities 5/5, lower extremities 5/5  Lymphadenopathy:    He has no cervical adenopathy.  Neurological: He is alert and oriented to person, place, and time.  Distal sensation intact to light touch all extremities  Skin: Skin is warm and dry. No rash noted. He is not diaphoretic. No erythema.  Psychiatric: He has a normal mood and affect. His behavior is normal.  Well groomed, good  eye contact, normal speech and thoughts  Nursing note and vitals reviewed.    Recent Labs  05/04/16 1008 02/12/17 1529  HGBA1C 6.1* 5.6    Results for orders placed or performed in visit on 02/12/17  POCT glycosylated hemoglobin (Hb A1C)  Result Value Ref Range   Hemoglobin A1C 5.6 5.7      Assessment & Plan:   Problem List Items Addressed This Visit    Benign prostatic hyperplasia with weak urinary stream    Consistent clinically with new diagnosis BPH, with lower urinary tract symptoms (LUTS) - AUA BPH score 11 (no prior) - Never on meds - Last PSA never (ordered today) - Last DRE 1 year ago reported mild enlarged - No known personal/family history of prostate CA  Plan: 1. Start Saw Palmetto OTC  BID 2. Follow-up as needed if not improved consider rx Flomax      Relevant  Medications   saw palmetto 160 MG capsule   Other Relevant Orders   PSA   Constipation    Consistent with more functional constipation based on chronic history and intermittent episodes.  Plan: 1. Start Miralax OTC titration instructions given 2. Improve hydration, inc fiber in diet, handouts given 3. Follow-up if not improved      Relevant Medications   polyethylene glycol powder (GLYCOLAX/MIRALAX) powder   Hyperlipidemia    Previously uncontrolled cholesterol on with limited exercise due to knee Last lipid panel 04/2016 Calculated ASCVD 10 yr risk score 7.6%  Plan: 1. Future would recommend Fish Oil - discussed that based on previous results did not recommend statin at this time, however now due for repeat lipids - will check and calculate ASCVD risk 2. Consider ASA  for primary ASCVD risk reduction 3. Encourage improved lifestyle - low carb/cholesterol, reduce portion size, continue improving regular exercise as tolerated with knee 4. Follow-up 1 year annual      Relevant Orders   Lipid panel   Hypertension    Well controlled HTN Home reading: reportedly normal No known complication  Plan: 1. Continue Losartan-HCTZ 100-25mg , and Amlodipine 2.5mg  daily 2. Encouraged improve lifestyle mods with diet and in future can improve regular exercise (as tolerated by knee) 3. Check future labs at [facility] 4. Monitor BP outside office 5. Follow-up q 1 year for annual - sooner if BP elevated outside office      Relevant Orders   Comprehensive metabolic panel   CBC with Differential/Platelet   Morbid obesity with BMI of 40.0-44.9, adult (HCC)    Gradual wt loss trend with improved diet, still limited exercise Due for repeat labs Encouraged continue improve lifestyle      Relevant Orders   CBC with Differential/Platelet   Pre-diabetes    Well-controlled Pre-DM with A1c down to 5.6, significant improvement from prior 6.1 Concern with obesity, HTN, HLD  Plan:  1. Not on  any therapy currently  2. Encourage improved lifestyle - low carb, low sugar diet, reduce portion size, continue improving regular exercise in future as tolerated by knee 3. Follow-up q 1 year for A1c, [facility]      Relevant Orders   POCT glycosylated hemoglobin (Hb A1C) (Completed)   Screening for prostate cancer    Avg / Low risk patient, with prior reported negative screening - Last PSA: never had, age 62 - Last DRE 1 year ago, reported mild enlarged - Clinically with some LUTS  Plan: 1. Reviewed prostate cancer screening guidelines and risks including potential prostate biopsy if abnormal PSA, proceed  with yearly PSA for now, and anticipate DRE as needed especially if abnormal PSA or new symptoms - Discussed that routine age 59 for initiating screening, he agrees to test now since he will be >52 at next year annual, and already with symptoms since treating with meds now as well for BPH      Relevant Orders   PSA    Other Visit Diagnoses    Annual physical exam    -  Primary   Relevant Orders   Comprehensive metabolic panel   Lipid panel   CBC with Differential/Platelet   PSA   Needs flu shot       Relevant Orders   Flu Vaccine QUAD 36+ mos IM (Completed)      Meds ordered this encounter  Medications  . saw palmetto 160 MG capsule    Sig: Take 1 capsule (160 mg total) by mouth 2 (two) times daily.  . polyethylene glycol powder (GLYCOLAX/MIRALAX) powder    Sig: Take 17 g by mouth daily as needed.    Dispense:  250 g    Refill:  1    Follow up plan: Return in about 1 year (around 02/12/2018) for Annual Physical.  Saralyn Pilar, DO [facility] 02/12/2017, 10:20 PM

## 2017-02-20 ENCOUNTER — Ambulatory Visit
Admission: RE | Admit: 2017-02-20 | Discharge: 2017-02-20 | Disposition: A | Discharge status: Home / Self Care | Payer: BLUE CROSS/BLUE SHIELD | Source: Ambulatory Visit | Attending: Orthopedic Surgery | Admitting: Orthopedic Surgery

## 2017-02-20 DIAGNOSIS — M179 Osteoarthritis of knee, unspecified: Secondary | ICD-10-CM | POA: Diagnosis not present

## 2017-02-20 DIAGNOSIS — M23221 Derangement of posterior horn of medial meniscus due to old tear or injury, right knee: Secondary | ICD-10-CM | POA: Diagnosis not present

## 2017-02-20 DIAGNOSIS — G8929 Other chronic pain: Secondary | ICD-10-CM

## 2017-02-20 DIAGNOSIS — M25561 Pain in right knee: Secondary | ICD-10-CM | POA: Diagnosis present

## 2017-02-20 DIAGNOSIS — M1711 Unilateral primary osteoarthritis, right knee: Secondary | ICD-10-CM | POA: Diagnosis not present

## 2017-02-28 DIAGNOSIS — E782 Mixed hyperlipidemia: Secondary | ICD-10-CM | POA: Diagnosis not present

## 2017-02-28 DIAGNOSIS — Z0000 Encounter for general adult medical examination without abnormal findings: Secondary | ICD-10-CM | POA: Diagnosis not present

## 2017-02-28 DIAGNOSIS — R3912 Poor urinary stream: Secondary | ICD-10-CM | POA: Diagnosis not present

## 2017-02-28 DIAGNOSIS — Z125 Encounter for screening for malignant neoplasm of prostate: Secondary | ICD-10-CM | POA: Diagnosis not present

## 2017-02-28 DIAGNOSIS — I10 Essential (primary) hypertension: Secondary | ICD-10-CM | POA: Diagnosis not present

## 2017-02-28 DIAGNOSIS — Z6841 Body Mass Index (BMI) 40.0 and over, adult: Secondary | ICD-10-CM | POA: Diagnosis not present

## 2017-02-28 DIAGNOSIS — N401 Enlarged prostate with lower urinary tract symptoms: Secondary | ICD-10-CM | POA: Diagnosis not present

## 2017-02-28 DIAGNOSIS — S83231A Complex tear of medial meniscus, current injury, right knee, initial encounter: Secondary | ICD-10-CM | POA: Diagnosis not present

## 2017-03-01 LAB — COMPREHENSIVE METABOLIC PANEL
A/G RATIO: 1.2 (ref 1.2–2.2)
ALT: 34 IU/L (ref 0–44)
AST: 28 IU/L (ref 0–40)
Albumin: 4.4 g/dL (ref 3.5–5.5)
Alkaline Phosphatase: 81 IU/L (ref 39–117)
BUN/Creatinine Ratio: 11 (ref 9–20)
BUN: 12 mg/dL (ref 6–24)
Bilirubin Total: 0.4 mg/dL (ref 0.0–1.2)
CALCIUM: 9.7 mg/dL (ref 8.7–10.2)
CO2: 25 mmol/L (ref 20–29)
CREATININE: 1.06 mg/dL (ref 0.76–1.27)
Chloride: 100 mmol/L (ref 96–106)
GFR, EST AFRICAN AMERICAN: 95 mL/min/{1.73_m2} (ref >59)
GFR, EST NON AFRICAN AMERICAN: 82 mL/min/{1.73_m2} (ref >59)
GLOBULIN, TOTAL: 3.6 g/dL (ref 1.5–4.5)
Glucose: 92 mg/dL (ref 65–99)
Potassium: 4.9 mmol/L (ref 3.5–5.2)
Sodium: 139 mmol/L (ref 134–144)
TOTAL PROTEIN: 8 g/dL (ref 6.0–8.5)

## 2017-03-01 LAB — CBC WITH DIFFERENTIAL/PLATELET
BASOS: 1 %
Basophils Absolute: 0 10*3/uL (ref 0.0–0.2)
EOS (ABSOLUTE): 0.1 10*3/uL (ref 0.0–0.4)
Eos: 2 %
HEMOGLOBIN: 14.6 g/dL (ref 13.0–17.7)
Hematocrit: 41.3 % (ref 37.5–51.0)
IMMATURE GRANS (ABS): 0 10*3/uL (ref 0.0–0.1)
IMMATURE GRANULOCYTES: 0 %
LYMPHS: 54 %
Lymphocytes Absolute: 3 10*3/uL (ref 0.7–3.1)
MCH: 28.3 pg (ref 26.6–33.0)
MCHC: 35.4 g/dL (ref 31.5–35.7)
MCV: 80 fL (ref 79–97)
MONOCYTES: 8 %
Monocytes Absolute: 0.4 10*3/uL (ref 0.1–0.9)
NEUTROS ABS: 1.9 10*3/uL (ref 1.4–7.0)
NEUTROS PCT: 35 %
PLATELETS: 325 10*3/uL (ref 150–379)
RBC: 5.16 x10E6/uL (ref 4.14–5.80)
RDW: 15.7 % — ABNORMAL HIGH (ref 12.3–15.4)
WBC: 5.4 10*3/uL (ref 3.4–10.8)

## 2017-03-01 LAB — LIPID PANEL
CHOLESTEROL TOTAL: 154 mg/dL (ref 100–199)
Chol/HDL Ratio: 4.5 ratio (ref 0.0–5.0)
HDL: 34 mg/dL — AB (ref >39)
LDL Calculated: 95 mg/dL (ref 0–99)
TRIGLYCERIDES: 125 mg/dL (ref 0–149)
VLDL Cholesterol Cal: 25 mg/dL (ref 5–40)

## 2017-03-01 LAB — PSA: PROSTATE SPECIFIC AG, SERUM: 1.1 ng/mL (ref 0.0–4.0)

## 2017-03-04 ENCOUNTER — Encounter: Payer: Self-pay | Admitting: *Deleted

## 2017-03-19 ENCOUNTER — Encounter
Admission: RE | Disposition: A | Discharge status: Home / Self Care | Payer: Self-pay | Source: Ambulatory Visit | Attending: Orthopedic Surgery

## 2017-03-19 ENCOUNTER — Ambulatory Visit: Payer: BLUE CROSS/BLUE SHIELD | Admitting: Anesthesiology

## 2017-03-19 ENCOUNTER — Ambulatory Visit
Admission: RE | Admit: 2017-03-19 | Discharge: 2017-03-19 | Disposition: A | Discharge status: Home / Self Care | Payer: BLUE CROSS/BLUE SHIELD | Source: Ambulatory Visit | Attending: Orthopedic Surgery | Admitting: Orthopedic Surgery

## 2017-03-19 DIAGNOSIS — I10 Essential (primary) hypertension: Secondary | ICD-10-CM | POA: Diagnosis not present

## 2017-03-19 DIAGNOSIS — E785 Hyperlipidemia, unspecified: Secondary | ICD-10-CM | POA: Insufficient documentation

## 2017-03-19 DIAGNOSIS — M1711 Unilateral primary osteoarthritis, right knee: Secondary | ICD-10-CM | POA: Diagnosis not present

## 2017-03-19 DIAGNOSIS — M94261 Chondromalacia, right knee: Secondary | ICD-10-CM | POA: Diagnosis not present

## 2017-03-19 DIAGNOSIS — S83231A Complex tear of medial meniscus, current injury, right knee, initial encounter: Secondary | ICD-10-CM | POA: Insufficient documentation

## 2017-03-19 DIAGNOSIS — Z6841 Body Mass Index (BMI) 40.0 and over, adult: Secondary | ICD-10-CM | POA: Diagnosis not present

## 2017-03-19 DIAGNOSIS — Z79899 Other long term (current) drug therapy: Secondary | ICD-10-CM | POA: Diagnosis not present

## 2017-03-19 DIAGNOSIS — R7303 Prediabetes: Secondary | ICD-10-CM | POA: Insufficient documentation

## 2017-03-19 DIAGNOSIS — M2241 Chondromalacia patellae, right knee: Secondary | ICD-10-CM | POA: Diagnosis not present

## 2017-03-19 DIAGNOSIS — M23231 Derangement of other medial meniscus due to old tear or injury, right knee: Secondary | ICD-10-CM | POA: Diagnosis not present

## 2017-03-19 DIAGNOSIS — Y9302 Activity, running: Secondary | ICD-10-CM | POA: Diagnosis not present

## 2017-03-19 DIAGNOSIS — X58XXXA Exposure to other specified factors, initial encounter: Secondary | ICD-10-CM | POA: Insufficient documentation

## 2017-03-19 DIAGNOSIS — Y929 Unspecified place or not applicable: Secondary | ICD-10-CM | POA: Diagnosis not present

## 2017-03-19 HISTORY — PX: KNEE ARTHROSCOPY WITH MENISCAL REPAIR: SHX5653

## 2017-03-19 SURGERY — ARTHROSCOPY, KNEE, WITH MEDIAL MENISCECTOMY
Anesthesia: General | Laterality: Right

## 2017-03-19 SURGERY — ARTHROSCOPY, KNEE, WITH MENISCUS REPAIR
Anesthesia: General | Laterality: Right | Wound class: Clean

## 2017-03-19 MED ORDER — LACTATED RINGERS IV SOLN
10.0000 mL/h | INTRAVENOUS | Status: DC
  Administered 2017-03-19: 10 mL/h via INTRAVENOUS

## 2017-03-19 MED ORDER — FENTANYL CITRATE (PF) 100 MCG/2ML IJ SOLN
INTRAMUSCULAR | Status: DC | PRN
  Administered 2017-03-19: 100 ug via INTRAVENOUS

## 2017-03-19 MED ORDER — ACETAMINOPHEN 10 MG/ML IV SOLN
1000.0000 mg | Freq: Once | INTRAVENOUS | Status: AC
  Administered 2017-03-19: 1000 mg via INTRAVENOUS

## 2017-03-19 MED ORDER — IBUPROFEN 800 MG PO TABS
800.0000 mg | ORAL_TABLET | Freq: Three times a day (TID) | ORAL | 0 refills | Status: AC
Start: 2017-03-19 — End: 2017-04-02

## 2017-03-19 MED ORDER — ASPIRIN EC 325 MG PO TBEC: 325.0000 mg | DELAYED_RELEASE_TABLET | Freq: Every day | ORAL | 0 refills | Status: AC

## 2017-03-19 MED ORDER — OXYCODONE HCL 5 MG/5ML PO SOLN: 5.0000 mg | Freq: Once | ORAL | Status: AC | PRN

## 2017-03-19 MED ORDER — ROPIVACAINE HCL 5 MG/ML IJ SOLN
INTRAMUSCULAR | Status: DC | PRN
  Administered 2017-03-19: 20 mL via EPIDURAL

## 2017-03-19 MED ORDER — ONDANSETRON 4 MG PO TBDP: 4.0000 mg | ORAL_TABLET | Freq: Three times a day (TID) | ORAL | 0 refills | Status: DC | PRN

## 2017-03-19 MED ORDER — ONDANSETRON HCL 4 MG/2ML IJ SOLN
INTRAMUSCULAR | Status: DC | PRN
  Administered 2017-03-19: 4 mg via INTRAVENOUS

## 2017-03-19 MED ORDER — MEPERIDINE HCL 25 MG/ML IJ SOLN: 6.2500 mg | INTRAMUSCULAR | Status: DC | PRN

## 2017-03-19 MED ORDER — PROPOFOL 10 MG/ML IV BOLUS
INTRAVENOUS | Status: DC | PRN
  Administered 2017-03-19: 200 mg via INTRAVENOUS
  Administered 2017-03-19 (×2): 100 mg via INTRAVENOUS

## 2017-03-19 MED ORDER — DEXTROSE 5 % IV SOLN
3.0000 g | Freq: Once | INTRAVENOUS | Status: AC
  Administered 2017-03-19: 3 g via INTRAVENOUS

## 2017-03-19 MED ORDER — DEXAMETHASONE SODIUM PHOSPHATE 4 MG/ML IJ SOLN
INTRAMUSCULAR | Status: DC | PRN
  Administered 2017-03-19: 4 mg via INTRAVENOUS

## 2017-03-19 MED ORDER — PROMETHAZINE HCL 25 MG/ML IJ SOLN: 6.2500 mg | INTRAMUSCULAR | Status: DC | PRN

## 2017-03-19 MED ORDER — GABAPENTIN 600 MG PO TABS: 600.0000 mg | ORAL_TABLET | Freq: Once | ORAL | Status: DC

## 2017-03-19 MED ORDER — BUPIVACAINE HCL 0.5 % IJ SOLN
INTRAMUSCULAR | Status: DC | PRN
Start: 2017-03-19 — End: 2017-03-19
  Administered 2017-03-19: 10 mL

## 2017-03-19 MED ORDER — FENTANYL CITRATE (PF) 100 MCG/2ML IJ SOLN
25.0000 ug | INTRAMUSCULAR | Status: DC | PRN
  Administered 2017-03-19 (×3): 25 ug via INTRAVENOUS

## 2017-03-19 MED ORDER — MIDAZOLAM HCL 5 MG/5ML IJ SOLN
INTRAMUSCULAR | Status: DC | PRN
  Administered 2017-03-19: 2 mg via INTRAVENOUS

## 2017-03-19 MED ORDER — OXYCODONE HCL 5 MG PO TABS
5.0000 mg | ORAL_TABLET | Freq: Once | ORAL | Status: AC | PRN
  Administered 2017-03-19: 5 mg via ORAL

## 2017-03-19 MED ORDER — LIDOCAINE-EPINEPHRINE 1 %-1:100000 IJ SOLN
INTRAMUSCULAR | Status: DC | PRN
  Administered 2017-03-19: 10 mL

## 2017-03-19 MED ORDER — GLYCOPYRROLATE 0.2 MG/ML IJ SOLN
INTRAMUSCULAR | Status: DC | PRN
  Administered 2017-03-19: .2 mg via INTRAVENOUS

## 2017-03-19 MED ORDER — LIDOCAINE HCL (CARDIAC) 20 MG/ML IV SOLN
INTRAVENOUS | Status: DC | PRN
  Administered 2017-03-19: 50 mg via INTRATRACHEAL

## 2017-03-19 MED ORDER — HYDROCODONE-ACETAMINOPHEN 5-325 MG PO TABS: 1.0000 | ORAL_TABLET | ORAL | 0 refills | Status: DC | PRN

## 2017-03-19 SURGICAL SUPPLY — 34 items
ADAPTER IRRIG TUBE 2 SPIKE SOL (ADAPTER) ×4 IMPLANT
BLADE SURG 15 STRL LF DISP TIS (BLADE) ×1 IMPLANT
BLADE SURG 15 STRL SS (BLADE) ×1
BLADE SURG SZ11 CARB STEEL (BLADE) ×2 IMPLANT
BNDG COHESIVE 4X5 TAN STRL (GAUZE/BANDAGES/DRESSINGS) ×2 IMPLANT
BUR RADIUS 3.5 (BURR) ×2 IMPLANT
CHLORAPREP W/TINT 26ML (MISCELLANEOUS) ×2 IMPLANT
COOLER POLAR GLACIER W/PUMP (MISCELLANEOUS) ×2 IMPLANT
COVER LIGHT HANDLE UNIVERSAL (MISCELLANEOUS) ×4 IMPLANT
CUFF TOURN SGL QUICK 34 (TOURNIQUET CUFF) ×1
CUFF TRNQT CYL 34X4X40X1 (TOURNIQUET CUFF) ×1 IMPLANT
DRAPE IMP U-DRAPE 54X76 (DRAPES) ×2 IMPLANT
GAUZE SPONGE 4X4 12PLY STRL (GAUZE/BANDAGES/DRESSINGS) ×2 IMPLANT
GLOVE BIO SURGEON STRL SZ7.5 (GLOVE) ×2 IMPLANT
GLOVE BIOGEL PI IND STRL 8 (GLOVE) ×1 IMPLANT
GLOVE BIOGEL PI INDICATOR 8 (GLOVE) ×1
GOWN STRL REUS W/ TWL LRG LVL3 (GOWN DISPOSABLE) ×1 IMPLANT
GOWN STRL REUS W/TWL LRG LVL3 (GOWN DISPOSABLE) ×3 IMPLANT
IV LACTATED RINGER IRRG 3000ML (IV SOLUTION) ×4
IV LR IRRIG 3000ML ARTHROMATIC (IV SOLUTION) ×4 IMPLANT
KIT RM TURNOVER STRD PROC AR (KITS) ×2 IMPLANT
MAT BLUE FLOOR 46X72 FLO (MISCELLANEOUS) ×2 IMPLANT
NEPTUNE MANIFOLD (MISCELLANEOUS) ×2 IMPLANT
PACK ARTHROSCOPY KNEE (MISCELLANEOUS) ×2 IMPLANT
PAD WRAPON POLAR KNEE (MISCELLANEOUS) ×1 IMPLANT
PADDING CAST BLEND 6X4 STRL (MISCELLANEOUS) ×1 IMPLANT
PADDING STRL CAST 6IN (MISCELLANEOUS) ×1
SET TUBE SUCT SHAVER OUTFL 24K (TUBING) ×2 IMPLANT
SUT ETHILON 3-0 FS-10 30 BLK (SUTURE) ×2
SUTURE EHLN 3-0 FS-10 30 BLK (SUTURE) ×1 IMPLANT
TOWEL OR 17X26 4PK STRL BLUE (TOWEL DISPOSABLE) ×4 IMPLANT
TUBING ARTHRO INFLOW-ONLY STRL (TUBING) ×2 IMPLANT
WAND HAND CNTRL MULTIVAC 90 (MISCELLANEOUS) ×2 IMPLANT
WRAPON POLAR PAD KNEE (MISCELLANEOUS) ×2

## 2017-03-19 NOTE — Transfer of Care (Signed)
Immediate Anesthesia Transfer of Care Note  Patient: Richard Wheeler  Procedure(s) Performed: RIGHT PARTIAL MEDIAL MENISCECTOMY (Right )  Patient Location: PACU  Anesthesia Type: General  Level of Consciousness: awake, alert  and patient cooperative  Airway and Oxygen Therapy: Patient Spontanous Breathing and Patient connected to supplemental oxygen  Post-op Assessment: Post-op Vital signs reviewed, Patient's Cardiovascular Status Stable, Respiratory Function Stable, Patent Airway and No signs of Nausea or vomiting  Post-op Vital Signs: Reviewed and stable  Complications: No apparent anesthesia complications

## 2017-03-19 NOTE — Anesthesia Preprocedure Evaluation (Signed)
Anesthesia Evaluation  Patient identified by MRN, date of birth, ID band Patient awake    Reviewed: Allergy & Precautions, H&P , NPO status , Patient's Chart, lab work & pertinent test results, reviewed documented beta blocker date and time   Airway Mallampati: II  TM Distance: >3 FB Neck ROM: full    Dental no notable dental hx.    Pulmonary neg pulmonary ROS,    Pulmonary exam normal breath sounds clear to auscultation       Cardiovascular Exercise Tolerance: Good hypertension, negative cardio ROS   Rhythm:regular Rate:Normal     Neuro/Psych negative neurological ROS  negative psych ROS   GI/Hepatic negative GI ROS, Neg liver ROS,   Endo/Other  negative endocrine ROS  Renal/GU negative Renal ROS  negative genitourinary   Musculoskeletal   Abdominal   Peds  Hematology negative hematology ROS (+)   Anesthesia Other Findings   Reproductive/Obstetrics negative OB ROS                             Anesthesia Physical Anesthesia Plan  ASA: II  Anesthesia Plan: General   Post-op Pain Management:    Induction:   PONV Risk Score and Plan:   Airway Management Planned:   Additional Equipment:   Intra-op Plan:   Post-operative Plan:   Informed Consent: I have reviewed the patients History and Physical, chart, labs and discussed the procedure including the risks, benefits and alternatives for the proposed anesthesia with the patient or authorized representative who has indicated his/her understanding and acceptance.     Dental Advisory Given  Plan Discussed with: CRNA  Anesthesia Plan Comments:         Anesthesia Quick Evaluation  

## 2017-03-19 NOTE — Op Note (Signed)
Operative Note   SURGERY DATE: 03/19/2017  PRE-OP DIAGNOSIS:  1. Right medial meniscus tear  POST-OP DIAGNOSIS: 1. Right medial meniscus tear 2. Osteoarthritis of patellofemoral and lateral compartments  PROCEDURES:  1. Right knee arthroscopy, partial medial meniscectomy 2. Chondroplasty of patellofemoral and lateral compartments  SURGEON: Rosealee AlbeeSunny H. Maxen Rowland, MD  ANESTHESIA: Gen  ESTIMATED BLOOD LOSS:minimal  TOTAL IV FLUIDS: per anesthesia  INDICATION(S):  Richard Wheeler is a 51 y.o. male who initially injured his knee running a 5K around July 4th weekend. Hhe feels sensations of catching, especially with twisting motions of her knee and pain is located along the medial joint line. An MRI showed a medial meniscus tear.  OPERATIVE FINDINGS:   Examination under anesthesia:A careful examination under anesthesia was performed. Passive range of motion was: Hyperextension: 2. Extension: 0. Flexion: 120. Lachman: normal. Pivot Shift: normal. Posterior drawer: normal. Varus stability in full extension: normal. Varus stability in 30 degrees of flexion: normal. Valgus stability in full extension: normal. Valgus stability in 30 degrees of flexion: normal.  Intra-operative findings:A thorough arthroscopic examination of the knee was performed. The findings are: 1. Suprapatellar pouch: Normal 2. Undersurface of median ridge: Normal 3. Medial patellar facet: Grade 2-3 degenerative changes 4. Lateral patellar facet: Grade 1-2 degenerative changes 5. Trochlea: Grade 1 degenerative changes 6. Lateral gutter/popliteus tendon: Normal 7. Hoffa's fat pad: Inflamed 8. Medial gutter/plica: Normal 9. ACL: Normal 10. PCL: Normal 11. Medial meniscus: Complex tear with both radial and horizontal components of the posterior horn extending to the body. This tear involved almost the entire width of the meniscus.  12. Medial compartment cartilage: Grade 1 degenerative changes,  mostly on tibia 13. Lateral meniscus: Normal 14. Lateral compartment cartilage: Grade 2 degenerative changes to the tibia, normal femoral condyle  OPERATIVE REPORT:   I identified Richard Wheeler the pre-operative holding area. I marked theoperativeknee with my initials. I reviewed the risks and benefits of the proposed surgical intervention and the patient (and/or patient's guardian) wished to proceed. The patient was transferred to the operative suite and placed in the supine position with all bony prominences padded. Anesthesia was administered. Appropriate IV antibioticswere administered within 30 minutes before incision. The extremity was then prepped and draped in standard fashion. A time out was performed confirming the correct extremity, correct patient, and correct procedure.  Arthroscopy portals were marked. Local anesthetic was injected to the planned portal sites. The anterolateral portalwasestablished with an 11blade followed by asuperolateralportal to provide for outflow of the joint.   The arthroscope was placed in the anterolateral portal and theninto the suprapatellar pouch. A diagnostic knee scope was completed with the above findings. The medial meniscus tear was identified.  Next the medial portal was established under needle localization. The hypertrophic and synovitic fat pat was debrided. The MCL was pie-crusted for improved visualization. The meniscal tear was debrided using an arthroscopic biter and an oscillating shaver until the meniscus had stable borders.  A chondroplasty was performed of the lateral compartment and patellofemoral compartment such that there were stable cartilage edges without any loose fragments of cartilage. Arthroscopic fluid was removed from the joint.  The portals were closed with 3-0 Nylon suture. Sterile dressings included Xeroform, 4x4s, Sof-Rol, and Bias wrap. A Polarcare was placed.  The patient was then awakened and taken  to the PACU hemodynamically stable without complication.   POSTOPERATIVE PLAN: The patient will be discharged home today once they meet PACU criteria. Aspirin 325 mg daily was prescribed for 2 weeks for DVT  prophylaxis. Physical therapy will start on POD#3-4.Weight-bearing as tolerated. They will follow up in 2 weeks per protocol.

## 2017-03-19 NOTE — Anesthesia Procedure Notes (Signed)
Procedure Name: LMA Insertion Date/Time: 03/19/2017 1:13 PM Performed by: Andee PolesBush, Stavros Cail, CRNA Pre-anesthesia Checklist: Patient identified, Emergency Drugs available, Suction available, Timeout performed and Patient being monitored Patient Re-evaluated:Patient Re-evaluated prior to induction Oxygen Delivery Method: Circle system utilized Preoxygenation: Pre-oxygenation with 100% oxygen Induction Type: IV induction LMA: LMA inserted LMA Size: 5.0 Number of attempts: 1 Placement Confirmation: positive ETCO2 and breath sounds checked- equal and bilateral Tube secured with: Tape

## 2017-03-19 NOTE — Anesthesia Postprocedure Evaluation (Signed)
Anesthesia Post Note  Patient: Richard Wheeler  Procedure(s) Performed: RIGHT PARTIAL MEDIAL MENISCECTOMY (Right )  Patient location during evaluation: PACU Anesthesia Type: General Level of consciousness: awake and alert Pain management: pain level controlled Vital Signs Assessment: post-procedure vital signs reviewed and stable Respiratory status: spontaneous breathing, nonlabored ventilation, respiratory function stable and patient connected to nasal cannula oxygen Cardiovascular status: blood pressure returned to baseline and stable Postop Assessment: no apparent nausea or vomiting Anesthetic complications: no    SCOURAS, NICOLE ELAINE

## 2017-03-19 NOTE — Anesthesia Procedure Notes (Signed)
Anesthesia Regional Block: Adductor canal block   Pre-Anesthetic Checklist: ,, timeout performed, Correct Patient, Correct Site, Correct Laterality, Correct Procedure, Correct Position, site marked, Risks and benefits discussed,  Surgical consent,  Pre-op evaluation,  At surgeon's request and post-op pain management  Laterality: Right  Prep: chloraprep       Needles:  Injection technique: Single-shot  Needle Type: Stimiplex     Needle Length: 10cm  Needle Gauge: 22     Additional Needles:   Procedures:,,,, ultrasound used (permanent image in chart),,,,  Narrative:  Start time: 03/19/2017 11:47 AM End time: 03/19/2017 11:51 AM  Performed by: Personally  Anesthesiologist: Aldine ContesScouras, Nicole Elaine, MD

## 2017-03-19 NOTE — H&P (Signed)
Paper H&P to be scanned into permanent record. H&P reviewed. No significant changes noted.  

## 2017-03-19 NOTE — Discharge Instructions (Signed)
Arthroscopic Knee Surgery - Partial Meniscectomy  Post-Op Instructions  1. Bracing or crutches: Crutches will be provided at the time of discharge from the surgery center if you do not already have them.  2. Ice: You may be provided with a device [facility](Polar Care) that allows you to ice the affected area effectively. Otherwise you can ice manually.   3. Driving:  Plan on not driving for at least two weeks. Please note that you are advised NOT to drive while taking narcotic pain medications as you may be impaired and unsafe to drive.  4. Activity: Ankle pumps several times an hour while awake to prevent blood clots. Weight bearing: as tolerated. Use crutches for as needed (usually ~1 week or less) until pain allows you to ambulate without a limp. Bending and straightening the knee is unlimited. Elevate knee above heart level as much as possible for one week. Avoid standing more than 5 minutes (consecutively) for the first week.  Avoid long distance travel for 2 weeks.  5. Medications:  - You have been provided a prescription for narcotic pain medicine. After surgery, take 1-2 narcotic tablets every 4 hours if needed for severe pain.  - A prescription for anti-nausea medication will be provided in case the narcotic medicine causes nausea - take 1 tablet every 6 hours only if nauseated.  - Take ibuprofen 800 mg every 8 hours WITH food to reduce post-operative knee swelling. DO NOT STOP IBUPROFEN POST-OP UNTIL INSTRUCTED TO DO SO at first post-op office visit (10-14 days after surgery). However, please discontinue if you have any abdominal discomfort after taking this.  - Take enteric coated aspirin 325 mg once daily for 2 weeks to prevent blood clots.    6. Bandages: The physical therapist should change the bandages at the first post-op appointment. If needed, the dressing supplies have been provided to you.  7. Physical Therapy: 1-2 times per week for 6 weeks. Therapy typically starts on post  operative Day 3 or 4. You have been provided an order for physical therapy. The therapist will provide home exercises.  8. Work: May return to full work usually around 2-4 weeks (based on job) after 1st post-operative visit. May do light duty/desk job in approximately 1-2 weeks when off of narcotics, pain is well-controlled, and swelling has decreased.  9. Post-Op Appointments: Your first post-op appointment will be with Dr. Allena KatzPatel in approximately 2 weeks time.   If you find that they have not been scheduled please call the Orthopaedic Appointment front desk at [phone].

## 2017-03-20 ENCOUNTER — Encounter: Payer: Self-pay | Admitting: Orthopedic Surgery

## 2017-03-22 DIAGNOSIS — Z9889 Other specified postprocedural states: Secondary | ICD-10-CM | POA: Diagnosis not present

## 2017-03-27 DIAGNOSIS — Z9889 Other specified postprocedural states: Secondary | ICD-10-CM | POA: Diagnosis not present

## 2017-04-03 DIAGNOSIS — Z9889 Other specified postprocedural states: Secondary | ICD-10-CM | POA: Diagnosis not present

## 2017-05-24 ENCOUNTER — Telehealth: Payer: Self-pay

## 2017-05-24 DIAGNOSIS — I10 Essential (primary) hypertension: Secondary | ICD-10-CM

## 2017-05-24 MED ORDER — LOSARTAN POTASSIUM-HCTZ 100-25 MG PO TABS: 1.0000 | ORAL_TABLET | Freq: Every day | ORAL | 12 refills | Status: DC

## 2017-05-24 NOTE — Telephone Encounter (Signed)
I contacted the pt and he okayed sending the prescription over to [facility].

## 2017-05-24 NOTE — Telephone Encounter (Signed)
I contact [facility] [address] and they notified me that they had some In stock and haven't heard anything about it being on backorder.

## 2017-05-24 NOTE — Telephone Encounter (Signed)
Patient called reporting that his losartan-hctz 100/25 was recalled and the pharmacist told patient to call pcp for instructions.  Please advise.

## 2017-05-24 NOTE — Addendum Note (Signed)
Addended by: Lonna CobbUSSELL, KELITA L on: 05/24/2017 01:23 PM   Modules accepted: Orders

## 2017-05-24 NOTE — Telephone Encounter (Signed)
Patient called reporting that his Losartan-HCTZ 100/25 has been recalled.  The pharmacist told patient to call pcp to find out what to do.  Please advise

## 2018-02-19 ENCOUNTER — Ambulatory Visit: Payer: BLUE CROSS/BLUE SHIELD | Admitting: Family Medicine

## 2018-02-28 ENCOUNTER — Ambulatory Visit (INDEPENDENT_AMBULATORY_CARE_PROVIDER_SITE_OTHER): Payer: BLUE CROSS/BLUE SHIELD | Admitting: Family Medicine

## 2018-02-28 ENCOUNTER — Encounter: Payer: Self-pay | Admitting: Family Medicine

## 2018-02-28 VITALS — BP 130/84 | HR 99 | Temp 98.5°F | Resp 16 | Ht 67.0 in | Wt 266.4 lb

## 2018-02-28 DIAGNOSIS — R7303 Prediabetes: Secondary | ICD-10-CM | POA: Diagnosis not present

## 2018-02-28 DIAGNOSIS — N401 Enlarged prostate with lower urinary tract symptoms: Secondary | ICD-10-CM

## 2018-02-28 DIAGNOSIS — Z23 Encounter for immunization: Secondary | ICD-10-CM | POA: Diagnosis not present

## 2018-02-28 DIAGNOSIS — E782 Mixed hyperlipidemia: Secondary | ICD-10-CM

## 2018-02-28 DIAGNOSIS — R3912 Poor urinary stream: Secondary | ICD-10-CM

## 2018-02-28 DIAGNOSIS — I10 Essential (primary) hypertension: Secondary | ICD-10-CM | POA: Diagnosis not present

## 2018-02-28 DIAGNOSIS — Z125 Encounter for screening for malignant neoplasm of prostate: Secondary | ICD-10-CM

## 2018-02-28 DIAGNOSIS — Z0000 Encounter for general adult medical examination without abnormal findings: Secondary | ICD-10-CM | POA: Diagnosis not present

## 2018-02-28 DIAGNOSIS — Z6841 Body Mass Index (BMI) 40.0 and over, adult: Secondary | ICD-10-CM

## 2018-02-28 DIAGNOSIS — Z1211 Encounter for screening for malignant neoplasm of colon: Secondary | ICD-10-CM

## 2018-02-28 MED ORDER — VALSARTAN-HYDROCHLOROTHIAZIDE 160-25 MG PO TABS: 1.0000 | ORAL_TABLET | Freq: Every day | ORAL | 1 refills | Status: DC

## 2018-02-28 MED ORDER — AMLODIPINE BESYLATE 2.5 MG PO TABS: 2.5000 mg | ORAL_TABLET | Freq: Every day | ORAL | 1 refills | Status: DC

## 2018-02-28 MED ORDER — TAMSULOSIN HCL 0.4 MG PO CAPS: 0.4000 mg | ORAL_CAPSULE | Freq: Every day | ORAL | 2 refills | Status: DC

## 2018-02-28 NOTE — Progress Notes (Signed)
Subjective:    Patient ID: Richard Wheeler, male    DOB: 01/31/1966, 52 y.o.   MRN: [id number]  Richard Wheeler is a 52 y.o. male presenting on 02/28/2018 for Annual Exam   HPI  Here for Annual Exam. He gets labs done through [facility] and will go there this week after receives orders from today.  CHRONIC HTN: Complaint that Losartan keeps getting recalled, wants to switch. BP checking at home, 120-130s / 80-85 Current meds: Losartan-HCTZ 100-43m daily, Amlodipine 2.573mReports good compliance, took meds today. Tolerating well, w/o complaints.  HYPERLIPIDEMIA: Results show improved lipids, now with controlled LDL and TG. Still slightly low HDL Not on statin Regular exercise  Pre-Diabetes / Morbid Obesity BMI >41 - Last A1c 5.6. He has tried to improve lifestyle. No significant fam history DM Lifestyle: - Weight down 5 lbs in 10 months - Diet: improving healthy diet, limit portions, low salt - Exercise: Limited due to knee pain, [facility], had injection now anticipating MRI  BPH with LUTS History of some lower urinary tract symptoms. Never on rx medication, except already tried and failed Saw Palmetto was only taking DAILY - Today describes some urinary symptoms, see score below  REPEAT TODAY AUA BPH Symptom Score over past 1 month 1. Sensation of not emptying bladder post void - 2 2. Urinate less than 2 hour after finish last void - 4 3. Start/Stop several times during void - 1 (morning only) 4. Difficult to postpone urination - 3  5. Weak urinary stream - 4 6. Push or strain urination - 1 7. Nocturia - 0 times  Last score 02/12/17 - 11 (moderate) Total Score: 15 (Moderate BPH symptoms)   Health Maintenance: - Due for Flu Shot, will receive today  - Prostate CA Screening: UTD result -  Prior DRE reported mildly enlarged or "swollen" prostate last year 2017. PSA 1.1, negative. Currently with some moderate LUTS, see score above. No known family history  of prostate CA.  Colon CA Screening: Never had colonoscopy. Currently asymptomatic except occasional constipation. No known family history of colon CA. Due for screening test new referral to GI for colonoscopy although considering Cologuard, counseling given   [facility]/9 02/28/2018 02/12/2017 01/05/2016  Decreased Interest 0 0 0  Down, Depressed, Hopeless 0 0 0  PHQ - 2 Score 0 0 0    Past Medical History:  Diagnosis Date  . Infected cuticle 03/08/2015   Left Hand 4th digit   . Obesity    Past Surgical History:  Procedure Laterality Date  . KNEE ARTHROSCOPY WITH MENISCAL REPAIR Right 03/19/2017   Procedure: RIGHT PARTIAL MEDIAL MENISCECTOMY;  Surgeon: PaLeim FabryMD;  [place] [facility];  Laterality: Right;  . left knee surgery     Social History   Socioeconomic History  . Marital status: Married    Spouse name: Not on file  . Number of children: Not on file  . Years of education: Not on file  . Highest education level: Not on file  Occupational History  . Not on file  Social Needs  . Financial resource strain: Not on file  . Food insecurity:    Worry: Not on file    Inability: Not on file  . Transportation needs:    Medical: Not on file    Non-medical: Not on file  Tobacco Use  . Smoking status: Never Smoker  . Smokeless tobacco: Never Used  Substance and Sexual Activity  .  Alcohol use: No  . Drug use: No  . Sexual activity: Not on file  Lifestyle  . Physical activity:    Days per week: Not on file    Minutes per session: Not on file  . Stress: Not on file  Relationships  . Social connections:    Talks on phone: Not on file    Gets together: Not on file    Attends religious service: Not on file    Active member of club or organization: Not on file    Attends meetings of clubs or organizations: Not on file    Relationship status: Not on file  . Intimate partner violence:    Fear of current or ex partner:  Not on file    Emotionally abused: Not on file    Physically abused: Not on file    Forced sexual activity: Not on file  Other Topics Concern  . Not on file  Social History Narrative  . Not on file   Family History  Problem Relation Age of Onset  . Cancer Mother 54  . Multiple myeloma Mother   . Prostate cancer Neg Hx   . Bladder Cancer Neg Hx   . Kidney cancer Neg Hx   . Colon cancer Neg Hx    Current Outpatient Medications on File Prior to Visit  Medication Sig  . Multiple Vitamin (MULTIVITAMIN WITH MINERALS) TABS tablet Take 1 tablet by mouth daily.  . polyethylene glycol powder (GLYCOLAX/MIRALAX) powder Take 17 g by mouth daily as needed.   No current facility-administered medications on file prior to visit.     Review of Systems  Constitutional: Negative for activity change, appetite change, chills, diaphoresis, fatigue and fever.  HENT: Negative for congestion and hearing loss.   Eyes: Negative for visual disturbance.  Respiratory: Negative for apnea, cough, chest tightness, shortness of breath and wheezing.   Cardiovascular: Negative for chest pain, palpitations and leg swelling.  Gastrointestinal: Negative for abdominal pain, constipation, diarrhea, nausea and vomiting.  Endocrine: Negative for cold intolerance.  Genitourinary: Positive for frequency. Negative for decreased urine volume, difficulty urinating, dysuria, hematuria, testicular pain and urgency.  Musculoskeletal: Negative for arthralgias and neck pain.  Skin: Negative for rash.  Allergic/Immunologic: Negative for environmental allergies.  Neurological: Negative for dizziness, weakness, light-headedness, numbness and headaches.  Hematological: Negative for adenopathy.  Psychiatric/Behavioral: Negative for behavioral problems, dysphoric mood and sleep disturbance.   Per HPI unless specifically indicated above      Objective:    BP 130/84 (BP Location: Left Arm, Cuff Size: Normal)   Pulse 99   Temp  98.5 F (36.9 C) (Oral)   Resp 16   Ht '5\' 7"'  (1.702 m)   Wt 266 lb 6.4 oz (120.8 kg)   BMI 41.72 kg/m   Wt Readings from Last 3 Encounters:  02/28/18 266 lb 6.4 oz (120.8 kg)  03/19/17 265 lb (120.2 kg)  02/12/17 267 lb (121.1 kg)    Physical Exam  Constitutional: He is oriented to person, place, and time. He appears well-developed and well-nourished. No distress.  Well-appearing, comfortable, cooperative, obese  HENT:  Head: Normocephalic and atraumatic.  Mouth/Throat: Oropharynx is clear and moist.  Frontal / maxillary sinuses non-tender. Nares patent without purulence or edema. Bilateral TMs clear without erythema, effusion or bulging. Oropharynx clear without erythema, exudates, edema or asymmetry.  Eyes: Pupils are equal, round, and reactive to light. Conjunctivae and EOM are normal. Right eye exhibits no discharge. Left eye exhibits no discharge.  Neck:  Normal range of motion. Neck supple. No thyromegaly present.  Cardiovascular: Normal rate, regular rhythm, normal heart sounds and intact distal pulses.  No murmur heard. Pulmonary/Chest: Effort normal and breath sounds normal. No respiratory distress. He has no wheezes. He has no rales.  Abdominal: Soft. Bowel sounds are normal. He exhibits no distension and no mass. There is no tenderness.  Musculoskeletal: Normal range of motion. He exhibits no edema or tenderness.  Upper / Lower Extremities: - Normal muscle tone, strength bilateral upper extremities 5/5, lower extremities 5/5  Lymphadenopathy:    He has no cervical adenopathy.  Neurological: He is alert and oriented to person, place, and time.  Distal sensation intact to light touch all extremities  Skin: Skin is warm and dry. No rash noted. He is not diaphoretic. No erythema.  Psychiatric: He has a normal mood and affect. His behavior is normal.  Well groomed, good eye contact, normal speech and thoughts  Nursing note and vitals reviewed.  Results for orders placed or  performed in visit on 02/12/17  Comprehensive metabolic panel  Result Value Ref Range   Glucose 92 65 - 99 mg/dL   BUN 12 6 - 24 mg/dL   Creatinine, Ser 1.06 0.76 - 1.27 mg/dL   GFR calc non Af Amer 82 >59 mL/min/1.73   GFR calc Af Amer 95 >59 mL/min/1.73   BUN/Creatinine Ratio 11 9 - 20   Sodium 139 134 - 144 mmol/L   Potassium 4.9 3.5 - 5.2 mmol/L   Chloride 100 96 - 106 mmol/L   CO2 25 20 - 29 mmol/L   Calcium 9.7 8.7 - 10.2 mg/dL   Total Protein 8.0 6.0 - 8.5 g/dL   Albumin 4.4 3.5 - 5.5 g/dL   Globulin, Total 3.6 1.5 - 4.5 g/dL   Albumin/Globulin Ratio 1.2 1.2 - 2.2   Bilirubin Total 0.4 0.0 - 1.2 mg/dL   Alkaline Phosphatase 81 39 - 117 IU/L   AST 28 0 - 40 IU/L   ALT 34 0 - 44 IU/L  Lipid panel  Result Value Ref Range   Cholesterol, Total 154 100 - 199 mg/dL   Triglycerides 125 0 - 149 mg/dL   HDL 34 (L) >39 mg/dL   VLDL Cholesterol Cal 25 5 - 40 mg/dL   LDL Calculated 95 0 - 99 mg/dL   Chol/HDL Ratio 4.5 0.0 - 5.0 ratio  CBC with Differential/Platelet  Result Value Ref Range   WBC 5.4 3.4 - 10.8 x10E3/uL   RBC 5.16 4.14 - 5.80 x10E6/uL   Hemoglobin 14.6 13.0 - 17.7 g/dL   Hematocrit 41.3 37.5 - 51.0 %   MCV 80 79 - 97 fL   MCH 28.3 26.6 - 33.0 pg   MCHC 35.4 31.5 - 35.7 g/dL   RDW 15.7 (H) 12.3 - 15.4 %   Platelets 325 150 - 379 x10E3/uL   Neutrophils 35 Not Estab. %   Lymphs 54 Not Estab. %   Monocytes 8 Not Estab. %   Eos 2 Not Estab. %   Basos 1 Not Estab. %   Neutrophils Absolute 1.9 1.4 - 7.0 x10E3/uL   Lymphocytes Absolute 3.0 0.7 - 3.1 x10E3/uL   Monocytes Absolute 0.4 0.1 - 0.9 x10E3/uL   EOS (ABSOLUTE) 0.1 0.0 - 0.4 x10E3/uL   Basophils Absolute 0.0 0.0 - 0.2 x10E3/uL   Immature Granulocytes 0 Not Estab. %   Immature Grans (Abs) 0.0 0.0 - 0.1 x10E3/uL  PSA  Result Value Ref Range   Prostate Specific Ag,  Serum 1.1 0.0 - 4.0 ng/mL  POCT glycosylated hemoglobin (Hb A1C)  Result Value Ref Range   Hemoglobin A1C 5.6 5.7      Assessment & Plan:     Problem List Items Addressed This Visit    Benign prostatic hyperplasia with weak urinary stream    Consistent clinically with persistent diagnosis BPH, with lower urinary tract symptoms (LUTS) Inadequately treated, failed Saw Palmetto - AUA BPH score 11 > 15 - Last PSA 1.1 - Last DRE 2 year ago reported mild enlarged - No known personal/family history of prostate CA  Plan: 1. Start Tamsulosin 0.15m daily, advised on benefits, risks, if BP low caution with sudden standing up or position change 2. Follow-up 6 months, consider future increased dose (doxazosin) vs add Finasteride alpha blocker to reduce prostate size (note change in PSA), future referral to Urology if remains uncontrolled      Relevant Medications   tamsulosin (FLOMAX) 0.4 MG CAPS capsule   Other Relevant Orders   CBC with Differential/Platelet   PSA   Essential hypertension    Well controlled HTN Home reading: reportedly normal No known complication  Plan: 1. DISCONTINUE Losartan-HCTZ 100-240m  SWITCH off losartan to Valsartan -HCTZ 160-2561maily - Continue Amlodipine 2.5mg15mily = may inc in future if need 2. Encouraged improve lifestyle mods with diet and in future can improve regular exercise (as tolerated by knee) 3. [facility] 4. Monitor BP outside office 5. Follow-up q 6 months      Relevant Medications   valsartan-hydrochlorothiazide (DIOVAN-HCT) 160-25 MG tablet   amLODipine (NORVASC) 2.5 MG tablet   Other Relevant Orders   CBC with Differential/Platelet   Comprehensive metabolic panel   Hyperlipidemia   Relevant Medications   valsartan-hydrochlorothiazide (DIOVAN-HCT) 160-25 MG tablet   amLODipine (NORVASC) 2.5 MG tablet   Morbid obesity with BMI of 40.0-44.9, adult (HCC)    Weight stable in past year Encourage continue improving diet exercise lifestyle - goal wt loss      Relevant Orders   Lipid panel   Pre-diabetes    Due for A1c Previously  Well-controlled Pre-DM with A1c 5.6 Concern with obesity, HTN, HLD  Plan:  1. Not on any therapy currently  2. Encourage improved lifestyle - low carb, low sugar diet, reduce portion size, continue improving regular exercise in future as tolerated by knee  [facility]  Relevant Orders   Hemoglobin A1c   Screening for prostate cancer   Relevant Orders   PSA    Other Visit Diagnoses    Annual physical exam    -  Primary  Updated Health Maintenance information - Refer GI colonoscopy - PSA Flu shot Reviewed recent lab results with patient Encouraged improvement to lifestyle with diet and exercise - Goal of weight loss    Relevant Orders   Hemoglobin A1c   CBC with Differential/Platelet   Lipid panel   Comprehensive metabolic panel   Needs flu shot       Relevant Orders   Flu Vaccine QUAD 36+ mos IM (Completed)   Screen for colon cancer      Discussed colonoscopy vs cologuard Opted for colonoscopy - will schedule soon   Relevant Orders   Ambulatory referral to Gastroenterology      Meds ordered this encounter  Medications  . valsartan-hydrochlorothiazide (DIOVAN-HCT) 160-25 MG tablet    Sig: Take 1 tablet by mouth daily.    Dispense:  90  tablet    Refill:  1  . amLODipine (NORVASC) 2.5 MG tablet    Sig: Take 1 tablet (2.5 mg total) by mouth daily.    Dispense:  90 tablet    Refill:  1  . tamsulosin (FLOMAX) 0.4 MG CAPS capsule    Sig: Take 1 capsule (0.4 mg total) by mouth daily after breakfast.    Dispense:  30 capsule    Refill:  2   Orders Placed This Encounter  Procedures  . Flu Vaccine QUAD 36+ mos IM  . Hemoglobin A1c  . CBC with Differential/Platelet  . Lipid panel    Order Specific Question:   Has the patient fasted?    Answer:   Yes  . PSA  . Comprehensive metabolic panel    Order Specific Question:   Has the patient fasted?    Answer:   Yes  . Ambulatory referral to Gastroenterology    Referral Priority:   Routine     Referral Type:   Consultation    Referral Reason:   Specialty Services Required    Number of Visits Requested:   1     Follow up plan: Return in about 6 months (around 08/30/2018) for HTN, BPH.  Future labs printed and given to patient for [facility] today  Nobie Putnam, [facility] 02/28/2018, 4:13 PM

## 2018-02-28 NOTE — Patient Instructions (Addendum)
Thank you for coming to the office today.  For prostate - start Flomax (Tamsulosin) 0.44m capsule once daily after breakfast - every day to help improve prostate urine flow  Flu shot today  Most likely pain is arthritis in joints  Recommend to start taking Acetamionphen - Tylenol Extra Strength 5091mtabs - take 1 to 2 tabs per dose (max 100080mevery 6-8 hours for pain (take regularly, don't skip a dose for next 7 days), max 24 hour daily dose is 6 tablets or 3000m32mn the future you can repeat the same everyday Tylenol course for 1-2 weeks at a time.  - This is safe to take with anti-inflammatory medicines (Ibuprofen, Advil, Naproxen, Aleve, Meloxicam, Mobic) - if needed can try Aleve.  Labs will check for possibility of the multiple myeloma, if we have a new concern or abnormal result we can do the advanced test   [facility] [id number]: [phone] Cancer Screening: - For all adults age 44+ 80+tine colon cancer screening is highly recommended.     - Recent guidelines from [employer] starting age of 45 -16arly detection of colon cancer is important, because often there are no warning signs or symptoms, also if found early usually it can be cured. Late stage is hard to treat.  - If you are not interested in Colonoscopy screening (if done and normal you could be cleared for 5 to 10 years until next due), then Cologuard is an excellent alternative for screening test for Colon Cancer. It is highly sensitive for detecting DNA of colon cancer from even the earliest stages. Also, there is NO bowel prep required. - If Cologuard is NEGATIVE, then it is good for 3 years before next due - If Cologuard is POSITIVE, then it is strongly advised to get a Colonoscopy, which allows the GI doctor to locate the source of the cancer or polyp (even very early stage) and  treat it by removing it. ------------------------- If you would like to proceed with Cologuard (stool DNA test) - FIRST, call your insurance company and tell them you want to check cost of Cologuard tell them CPT Code [phone] may be completely covered and you could get for no cost, OR max cost without any coverage is about $600). Also, keep in mind if you do NOT open the kit, and decide not to do the test, you will NOT be charged, you should contact the company if you decide not to do the test. - If you want to proceed, you can notify us ([place] message, [facility] at next visit) and we will order it for you. The test kit will be delivered to you house within about 1 week. Follow instructions to collect sample, you may call the company for any help or questions, 24/7 telephone support at [phone] schedule a Follow-up Appointment to: Return in about 6 months (around 08/30/2018) for HTN, BPH.  If you have any other questions or concerns, please feel free to call the office or send a message through [facility] [place] may also schedule an earlier appointment if necessary.  Additionally, you may be receiving a survey about your experience at our office within a few days to 1 week by e-mail or mail. We value your feedback.  AlexNobie [place]

## 2018-03-01 NOTE — Assessment & Plan Note (Signed)
Well controlled HTN Home reading: reportedly normal No known complication  Plan: 1. DISCONTINUE Losartan-HCTZ 100-25mg -  SWITCH off losartan to Valsartan -HCTZ 160-25mg  daily - Continue Amlodipine 2.5mg  daily = may inc in future if need 2. Encouraged improve lifestyle mods with diet and in future can improve regular exercise (as tolerated by knee) 3. Check future labs at [facility] ordered today 4. Monitor BP outside office 5. Follow-up q 6 months

## 2018-03-01 NOTE — Assessment & Plan Note (Signed)
Weight stable in past year Encourage continue improving diet exercise lifestyle - goal wt loss

## 2018-03-01 NOTE — Assessment & Plan Note (Signed)
Due for A1c Previously Well-controlled Pre-DM with A1c 5.6 Concern with obesity, HTN, HLD  Plan:  1. Not on any therapy currently  2. Encourage improved lifestyle - low carb, low sugar diet, reduce portion size, continue improving regular exercise in future as tolerated by knee  Ordered A1c with labs at [facility]

## 2018-03-01 NOTE — Assessment & Plan Note (Signed)
Consistent clinically with persistent diagnosis BPH, with lower urinary tract symptoms (LUTS) Inadequately treated, failed [place] - AUA BPH score 11 > 15 - Last PSA 1.1 - Last DRE 2 year ago reported mild enlarged - No known personal/family history of prostate CA  Plan: 1. Start Tamsulosin 0.4mg  daily, advised on benefits, risks, if BP low caution with sudden standing up or position change 2. Follow-up 6 months, consider future increased dose (doxazosin) vs add Finasteride alpha blocker to reduce prostate size (note change in PSA), future referral to Urology if remains uncontrolled

## 2018-03-05 DIAGNOSIS — Z0000 Encounter for general adult medical examination without abnormal findings: Secondary | ICD-10-CM | POA: Diagnosis not present

## 2018-03-05 DIAGNOSIS — N401 Enlarged prostate with lower urinary tract symptoms: Secondary | ICD-10-CM | POA: Diagnosis not present

## 2018-03-05 DIAGNOSIS — R3912 Poor urinary stream: Secondary | ICD-10-CM | POA: Diagnosis not present

## 2018-03-05 DIAGNOSIS — I10 Essential (primary) hypertension: Secondary | ICD-10-CM | POA: Diagnosis not present

## 2018-03-05 DIAGNOSIS — R7303 Prediabetes: Secondary | ICD-10-CM | POA: Diagnosis not present

## 2018-03-05 DIAGNOSIS — Z125 Encounter for screening for malignant neoplasm of prostate: Secondary | ICD-10-CM | POA: Diagnosis not present

## 2018-03-06 LAB — COMPREHENSIVE METABOLIC PANEL
A/G RATIO: 1.4 (ref 1.2–2.2)
ALT: 40 IU/L (ref 0–44)
AST: 36 IU/L (ref 0–40)
Albumin: 4.6 g/dL (ref 3.5–5.5)
Alkaline Phosphatase: 71 IU/L (ref 39–117)
BILIRUBIN TOTAL: 0.4 mg/dL (ref 0.0–1.2)
BUN/Creatinine Ratio: 11 (ref 9–20)
BUN: 11 mg/dL (ref 6–24)
CHLORIDE: 99 mmol/L (ref 96–106)
CO2: 24 mmol/L (ref 20–29)
Calcium: 9.7 mg/dL (ref 8.7–10.2)
Creatinine, Ser: 1.01 mg/dL (ref 0.76–1.27)
GFR calc Af Amer: 100 mL/min/{1.73_m2} (ref >59)
GFR calc non Af Amer: 86 mL/min/{1.73_m2} (ref >59)
Globulin, Total: 3.2 g/dL (ref 1.5–4.5)
Glucose: 81 mg/dL (ref 65–99)
POTASSIUM: 4.5 mmol/L (ref 3.5–5.2)
Sodium: 139 mmol/L (ref 134–144)
Total Protein: 7.8 g/dL (ref 6.0–8.5)

## 2018-03-06 LAB — LIPID PANEL
CHOL/HDL RATIO: 5.3 ratio — AB (ref 0.0–5.0)
Cholesterol, Total: 175 mg/dL (ref 100–199)
HDL: 33 mg/dL — AB (ref >39)
LDL Calculated: 117 mg/dL — ABNORMAL HIGH (ref 0–99)
Triglycerides: 127 mg/dL (ref 0–149)
VLDL CHOLESTEROL CAL: 25 mg/dL (ref 5–40)

## 2018-03-06 LAB — CBC WITH DIFFERENTIAL/PLATELET
BASOS: 1 %
Basophils Absolute: 0 10*3/uL (ref 0.0–0.2)
EOS (ABSOLUTE): 0.1 10*3/uL (ref 0.0–0.4)
EOS: 1 %
HEMOGLOBIN: 14 g/dL (ref 13.0–17.7)
Hematocrit: 42.1 % (ref 37.5–51.0)
IMMATURE GRANS (ABS): 0 10*3/uL (ref 0.0–0.1)
IMMATURE GRANULOCYTES: 0 %
Lymphocytes Absolute: 2.9 10*3/uL (ref 0.7–3.1)
Lymphs: 60 %
MCH: 27.3 pg (ref 26.6–33.0)
MCHC: 33.3 g/dL (ref 31.5–35.7)
MCV: 82 fL (ref 79–97)
MONOS ABS: 0.4 10*3/uL (ref 0.1–0.9)
Monocytes: 7 %
NEUTROS PCT: 31 %
Neutrophils Absolute: 1.5 10*3/uL (ref 1.4–7.0)
Platelets: 342 10*3/uL (ref 150–450)
RBC: 5.13 x10E6/uL (ref 4.14–5.80)
RDW: 14.8 % (ref 12.3–15.4)
WBC: 4.9 10*3/uL (ref 3.4–10.8)

## 2018-03-06 LAB — HEMOGLOBIN A1C
Est. average glucose Bld gHb Est-mCnc: 123 mg/dL
Hgb A1c MFr Bld: 5.9 % — ABNORMAL HIGH (ref 4.8–5.6)

## 2018-03-06 LAB — PSA: PROSTATE SPECIFIC AG, SERUM: 1 ng/mL (ref 0.0–4.0)

## 2018-03-07 ENCOUNTER — Other Ambulatory Visit: Payer: Self-pay

## 2018-03-07 DIAGNOSIS — Z1211 Encounter for screening for malignant neoplasm of colon: Secondary | ICD-10-CM

## 2018-04-08 ENCOUNTER — Encounter: Payer: Self-pay | Admitting: *Deleted

## 2018-04-09 ENCOUNTER — Ambulatory Visit
Admission: RE | Admit: 2018-04-09 | Discharge: 2018-04-09 | Disposition: A | Discharge status: Home / Self Care | Payer: BLUE CROSS/BLUE SHIELD | Source: Ambulatory Visit | Attending: Gastroenterology | Admitting: Gastroenterology

## 2018-04-09 ENCOUNTER — Encounter
Admission: RE | Disposition: A | Discharge status: Home / Self Care | Payer: Self-pay | Source: Ambulatory Visit | Attending: Gastroenterology

## 2018-04-09 ENCOUNTER — Ambulatory Visit: Payer: BLUE CROSS/BLUE SHIELD | Admitting: Anesthesiology

## 2018-04-09 DIAGNOSIS — Z79899 Other long term (current) drug therapy: Secondary | ICD-10-CM | POA: Insufficient documentation

## 2018-04-09 DIAGNOSIS — E785 Hyperlipidemia, unspecified: Secondary | ICD-10-CM | POA: Diagnosis not present

## 2018-04-09 DIAGNOSIS — Z6841 Body Mass Index (BMI) 40.0 and over, adult: Secondary | ICD-10-CM | POA: Insufficient documentation

## 2018-04-09 DIAGNOSIS — I10 Essential (primary) hypertension: Secondary | ICD-10-CM | POA: Insufficient documentation

## 2018-04-09 DIAGNOSIS — K219 Gastro-esophageal reflux disease without esophagitis: Secondary | ICD-10-CM | POA: Diagnosis not present

## 2018-04-09 DIAGNOSIS — Z1211 Encounter for screening for malignant neoplasm of colon: Secondary | ICD-10-CM

## 2018-04-09 HISTORY — PX: COLONOSCOPY WITH PROPOFOL: SHX5780

## 2018-04-09 SURGERY — COLONOSCOPY WITH PROPOFOL
Anesthesia: General

## 2018-04-09 MED ORDER — PROPOFOL 500 MG/50ML IV EMUL
INTRAVENOUS | Status: DC | PRN
  Administered 2018-04-09: 200 ug/kg/min via INTRAVENOUS

## 2018-04-09 MED ORDER — PROPOFOL 500 MG/50ML IV EMUL
INTRAVENOUS | Status: AC
  Filled 2018-04-09: qty 50

## 2018-04-09 MED ORDER — FENTANYL CITRATE (PF) 100 MCG/2ML IJ SOLN
INTRAMUSCULAR | Status: AC
  Filled 2018-04-09: qty 2

## 2018-04-09 MED ORDER — LIDOCAINE HCL (PF) 1 % IJ SOLN
2.0000 mL | Freq: Once | INTRAMUSCULAR | Status: AC
  Administered 2018-04-09: 0.3 mL via INTRADERMAL

## 2018-04-09 MED ORDER — PROPOFOL 10 MG/ML IV BOLUS
INTRAVENOUS | Status: DC | PRN
  Administered 2018-04-09: 150 mg via INTRAVENOUS

## 2018-04-09 MED ORDER — SODIUM CHLORIDE 0.9 % IV SOLN
INTRAVENOUS | Status: DC | PRN
  Administered 2018-04-09: 13:00:00 via INTRAVENOUS

## 2018-04-09 MED ORDER — LIDOCAINE HCL (PF) 1 % IJ SOLN
INTRAMUSCULAR | Status: AC
  Administered 2018-04-09: 0.3 mL via INTRADERMAL
  Filled 2018-04-09: qty 2

## 2018-04-09 MED ORDER — PHENYLEPHRINE HCL 10 MG/ML IJ SOLN
INTRAMUSCULAR | Status: DC | PRN
  Administered 2018-04-09: 100 ug via INTRAVENOUS

## 2018-04-09 MED ORDER — LIDOCAINE HCL (PF) 2 % IJ SOLN
INTRAMUSCULAR | Status: AC
  Filled 2018-04-09: qty 10

## 2018-04-09 MED ORDER — LIDOCAINE 2% (20 MG/ML) 5 ML SYRINGE
INTRAMUSCULAR | Status: DC | PRN
  Administered 2018-04-09: 40 mg via INTRAVENOUS

## 2018-04-09 MED ORDER — FENTANYL CITRATE (PF) 100 MCG/2ML IJ SOLN
INTRAMUSCULAR | Status: DC | PRN
  Administered 2018-04-09: 100 ug via INTRAVENOUS

## 2018-04-09 MED ORDER — SODIUM CHLORIDE 0.9 % IV SOLN
INTRAVENOUS | Status: DC
  Administered 2018-04-09: 1000 mL via INTRAVENOUS

## 2018-04-09 NOTE — H&P (Addendum)
Jonathon Bellows, MD [address] [place], [place], [place], [address] [address], [place], [place], [place], [address] Phone: [phone]  Fax: [phone]  Primary Care Physician:  Olin Hauser, DO   Pre-Procedure History & Physical: HPI:  Richard Wheeler is a 52 y.o. male is here for an colonoscopy.   Past Medical History:  Diagnosis Date  . Infected cuticle 03/08/2015   Left Hand 4th digit   . Obesity     Past Surgical History:  Procedure Laterality Date  . KNEE ARTHROSCOPY WITH MENISCAL REPAIR Right 03/19/2017   Procedure: RIGHT PARTIAL MEDIAL MENISCECTOMY;  Surgeon: Leim Fabry, MD;  Location: [facility];  Service: Orthopedics;  Laterality: Right;  . left knee surgery      Prior to Admission medications   Medication Sig Start Date End Date Taking? Authorizing Provider  amLODipine (NORVASC) 2.5 MG tablet Take 1 tablet (2.5 mg total) by mouth daily. 02/28/18   Karamalegos, Devonne Doughty, DO  Multiple Vitamin (MULTIVITAMIN WITH MINERALS) TABS tablet Take 1 tablet by mouth daily.    [provider]  polyethylene glycol powder (GLYCOLAX/MIRALAX) powder Take 17 g by mouth daily as needed. 02/12/17   Karamalegos, Devonne Doughty, DO  tamsulosin (FLOMAX) 0.4 MG CAPS capsule Take 1 capsule (0.4 mg total) by mouth daily after breakfast. 02/28/18   Karamalegos, Devonne Doughty, DO  valsartan-hydrochlorothiazide (DIOVAN-HCT) 160-25 MG tablet Take 1 tablet by mouth daily. 02/28/18   Olin Hauser, DO    Allergies as of 03/10/2018  . (No Known Allergies)    Family History  Problem Relation Age of Onset  . Cancer Mother 23  . Multiple myeloma Mother   . Prostate cancer Neg Hx   . Bladder Cancer Neg Hx   . Kidney cancer Neg Hx   . Colon cancer Neg Hx     Social History   Socioeconomic History  . Marital status: Married    Spouse name: Not on file  . Number of children: Not on file  . Years of education: Not on file  . Highest  education level: Not on file  Occupational History  . Not on file  Social Needs  . Financial resource strain: Not on file  . Food insecurity:    Worry: Not on file    Inability: Not on file  . Transportation needs:    Medical: Not on file    Non-medical: Not on file  Tobacco Use  . Smoking status: Never Smoker  . Smokeless tobacco: Never Used  Substance and Sexual Activity  . Alcohol use: No  . Drug use: No  . Sexual activity: Not on file  Lifestyle  . Physical activity:    Days per week: Not on file    Minutes per session: Not on file  . Stress: Not on file  Relationships  . Social connections:    Talks on phone: Not on file    Gets together: Not on file    Attends religious service: Not on file    Active member of club or organization: Not on file    Attends meetings of clubs or organizations: Not on file    Relationship status: Not on file  . Intimate partner violence:    Fear of current or ex partner: Not on file    Emotionally abused: Not on file    Physically abused: Not on file    Forced sexual activity: Not on file  Other Topics Concern  . Not on  file  Social History Narrative  . Not on file    Review of Systems: See HPI, otherwise negative ROS  Physical Exam: BP (!) 151/96   Pulse 83   Temp (!) 97.3 F (36.3 C) (Tympanic)   Resp 16   Ht '5\' 7"'  (1.702 m)   Wt 120.8 kg   SpO2 97%   BMI 41.71 kg/m  General:   Alert,  pleasant and cooperative in NAD Head:  Normocephalic and atraumatic. Neck:  Supple; no masses or thyromegaly. Lungs:  Clear throughout to auscultation, normal respiratory effort.    Heart:  +S1, +S2, Regular rate and rhythm, No edema. Abdomen:  Soft, nontender and nondistended. Normal bowel sounds, without guarding, and without rebound.   Neurologic:  Alert and  oriented x4;  grossly normal neurologically.  Impression/Plan: Richard Wheeler is here for an colonoscopy to be performed for Screening colonoscopy average risk   Risks,  benefits, limitations, and alternatives regarding  colonoscopy have been reviewed with the patient.  Questions have been answered.  All parties agreeable.   Jonathon Bellows, MD  04/09/2018, 12:59 PM

## 2018-04-09 NOTE — Anesthesia Preprocedure Evaluation (Signed)
Anesthesia Evaluation  Patient identified by MRN, date of birth, ID band Patient awake    Reviewed: Allergy & Precautions, H&P , NPO status , reviewed documented beta blocker date and time   Airway Mallampati: IV  TM Distance: >3 FB Neck ROM: full    Dental  (+) Teeth Intact   Pulmonary    Pulmonary exam normal        Cardiovascular hypertension, Normal cardiovascular exam     Neuro/Psych    GI/Hepatic neg GERD  ,  Endo/Other  Morbid obesity  Renal/GU      Musculoskeletal  (+) Arthritis ,   Abdominal   Peds  Hematology   Anesthesia Other Findings Past Medical History: 03/08/2015: Infected cuticle     Comment:  Left Hand 4th digit  No date: Obesity  Past Surgical History: 03/19/2017: KNEE ARTHROSCOPY WITH MENISCAL REPAIR; Right     Comment:  Procedure: RIGHT PARTIAL MEDIAL MENISCECTOMY;  Surgeon:               Signa KellPatel, Sunny, MD;  [facility];                Service: Orthopedics;  Laterality: Right; No date: left knee surgery  BMI    Body Mass Index:  41.71 kg/m      Reproductive/Obstetrics                             Anesthesia Physical Anesthesia Plan  ASA: III  Anesthesia Plan: General   Post-op Pain Management:    Induction: Intravenous  PONV Risk Score and Plan: Treatment may vary due to age or medical condition and TIVA  Airway Management Planned: Nasal Cannula and Natural Airway  Additional Equipment:   Intra-op Plan:   Post-operative Plan:   Informed Consent: I have reviewed the patients History and Physical, chart, labs and discussed the procedure including the risks, benefits and alternatives for the proposed anesthesia with the patient or authorized representative who has indicated his/her understanding and acceptance.   Dental Advisory Given  Plan Discussed with: CRNA  Anesthesia Plan Comments:         Anesthesia Quick  Evaluation

## 2018-04-09 NOTE — Anesthesia Post-op Follow-up Note (Signed)
Anesthesia QCDR form completed.        

## 2018-04-09 NOTE — Transfer of Care (Signed)
Immediate Anesthesia Transfer of Care Note  Patient: Richard Wheeler  Procedure(s) Performed: COLONOSCOPY WITH PROPOFOL (N/A )  Patient Location: PACU and Endoscopy Unit  Anesthesia Type:General  Level of Consciousness: drowsy  Airway & Oxygen Therapy: Patient Spontanous Breathing and Patient connected to nasal cannula oxygen  Post-op Assessment: Report given to RN and Post -op Vital signs reviewed and stable  Post vital signs: Reviewed and stable  Last Vitals:  Vitals Value Taken Time  BP 66/40 04/09/2018  1:52 PM  Temp 36.1 C 04/09/2018  1:52 PM  Pulse 84 04/09/2018  1:57 PM  Resp 14 04/09/2018  1:57 PM  SpO2 98 % 04/09/2018  1:57 PM  Vitals shown include unvalidated device data.  Last Pain:  Vitals:   04/09/18 1352  TempSrc: Tympanic  PainSc: Asleep         Complications: No apparent anesthesia complications

## 2018-04-10 ENCOUNTER — Encounter: Payer: Self-pay | Admitting: Gastroenterology

## 2018-04-10 NOTE — Op Note (Signed)
[facility] Gastroenterology Patient Name: Richard Wheeler Procedure Date: 04/09/2018 12:31 PM MRN: [id number] Account #: [id number] Date of Birth: 1965-06-17 Admit Type: Outpatient Age: 52 Room: [facility] ENDO ROOM 1 Gender: Male Note Status: Finalized Procedure:            Colonoscopy Indications:          Screening for colorectal malignant neoplasm Providers:            Wyline Mood MD, MD Referring MD:         Smitty Cords (Referring MD) Medicines:            Monitored Anesthesia Care Complications:        No immediate complications. Procedure:            Pre-Anesthesia Assessment:                       - Prior to the procedure, a History and Physical was                        performed, and patient medications, allergies and                        sensitivities were reviewed. The patient's tolerance of                        previous anesthesia was reviewed.                       - The risks and benefits of the procedure and the                        sedation options and risks were discussed with the                        patient. All questions were answered and informed                        consent was obtained.                       - After reviewing the risks and benefits, the patient                        was deemed in satisfactory condition to undergo the                        procedure.                       - ASA Grade Assessment: II - A patient with mild                        systemic disease.                       After obtaining informed consent, the colonoscope was                        passed under direct vision. Throughout the procedure,                        the  patient's blood pressure, pulse, and oxygen                        saturations were monitored continuously. The                        Colonoscope was introduced through the anus and                        advanced to the the cecum, identified by the     appendiceal orifice, IC valve and transillumination.                        The colonoscopy was performed with ease. The patient                        tolerated the procedure well. The quality of the bowel                        preparation was good. Findings:      The entire examined colon appeared normal. Impression:           - The entire examined colon is normal.                       - No specimens collected. Recommendation:       - Discharge patient to home.                       - Resume previous diet.                       - Continue present medications.                       - Repeat colonoscopy in 10 years for screening purposes. Procedure Code(s):    --- Professional ---                       [id number], Colonoscopy, flexible; diagnostic, including                        collection of specimen(s) by brushing or washing, when                        performed (separate procedure) Diagnosis Code(s):    --- Professional ---                       Z12.11, Encounter for screening for malignant neoplasm                        of colon CPT copyright 2018 American Medical Association. All rights reserved. The codes documented in this report are preliminary and upon coder review may  be revised to meet current compliance requirements. Wyline MoodKiran Aldean Pipe, MD Wyline MoodKiran Susie Pousson MD, MD 04/09/2018 1:48:11 PM This report has been signed electronically. Number of Addenda: 0 Note Initiated On: 04/09/2018 12:31 PM Scope Withdrawal Time: 0 hours 17 minutes 31 seconds  Total Procedure Duration: 0 hours 22 minutes 26 seconds       [facility]

## 2018-04-10 NOTE — Anesthesia Postprocedure Evaluation (Signed)
Anesthesia Post Note  Patient: Richard Wheeler  Procedure(s) Performed: COLONOSCOPY WITH PROPOFOL (N/A )  Patient location during evaluation: Endoscopy Anesthesia Type: General Level of consciousness: awake and alert Pain management: pain level controlled Vital Signs Assessment: post-procedure vital signs reviewed and stable Respiratory status: spontaneous breathing, nonlabored ventilation and respiratory function stable Cardiovascular status: blood pressure returned to baseline and stable Postop Assessment: no apparent nausea or vomiting Anesthetic complications: no     Last Vitals:  Vitals:   04/09/18 1412 04/09/18 1422  BP: 120/80 116/82  Pulse: 89 83  Resp: 17 18  Temp:    SpO2: 96% 95%    Last Pain:  Vitals:   04/09/18 1412  TempSrc:   PainSc: 0-No pain                 Christia ReadingScott T Kayzen Kendzierski

## 2018-04-25 ENCOUNTER — Telehealth: Payer: Self-pay | Admitting: Family Medicine

## 2018-04-25 DIAGNOSIS — I10 Essential (primary) hypertension: Secondary | ICD-10-CM

## 2018-04-25 MED ORDER — AMLODIPINE BESYLATE 2.5 MG PO TABS: 2.5000 mg | ORAL_TABLET | Freq: Every day | ORAL | 1 refills | Status: DC

## 2018-04-25 NOTE — Telephone Encounter (Signed)
Pt needs a refill on amlodipine sent to [facility] [address]  His call back number is [phone]

## 2018-06-29 ENCOUNTER — Other Ambulatory Visit: Payer: Self-pay | Admitting: Family Medicine

## 2018-06-29 DIAGNOSIS — I10 Essential (primary) hypertension: Secondary | ICD-10-CM

## 2018-08-05 ENCOUNTER — Other Ambulatory Visit: Payer: Self-pay

## 2018-08-08 ENCOUNTER — Other Ambulatory Visit: Payer: Self-pay | Admitting: Family Medicine

## 2018-08-08 ENCOUNTER — Other Ambulatory Visit: Payer: Self-pay

## 2018-08-08 ENCOUNTER — Ambulatory Visit (INDEPENDENT_AMBULATORY_CARE_PROVIDER_SITE_OTHER): Payer: Self-pay | Admitting: Family Medicine

## 2018-08-08 ENCOUNTER — Encounter: Payer: Self-pay | Admitting: Family Medicine

## 2018-08-08 DIAGNOSIS — I10 Essential (primary) hypertension: Secondary | ICD-10-CM

## 2018-08-08 DIAGNOSIS — Z6841 Body Mass Index (BMI) 40.0 and over, adult: Secondary | ICD-10-CM

## 2018-08-08 DIAGNOSIS — R3912 Poor urinary stream: Secondary | ICD-10-CM

## 2018-08-08 DIAGNOSIS — R7303 Prediabetes: Secondary | ICD-10-CM

## 2018-08-08 DIAGNOSIS — N401 Enlarged prostate with lower urinary tract symptoms: Secondary | ICD-10-CM

## 2018-08-08 DIAGNOSIS — Z0000 Encounter for general adult medical examination without abnormal findings: Secondary | ICD-10-CM

## 2018-08-08 DIAGNOSIS — E782 Mixed hyperlipidemia: Secondary | ICD-10-CM

## 2018-08-08 NOTE — Patient Instructions (Addendum)
Restart Flomax Tamsulosin 0.4mg  once daily for prostate - it can help strengthen your urinary stream.  If not helping enough you can DOUBLE the dose - take 2 pills instead of 1. Notify our office if you want a new rx on this med in future.  Or if you want to see Urologist specialist for consultation on prostate / urinary symptoms.  ALSO - we can consider REDUCING or STOPPING Fluid pill in future and adjusting other blood pressure pill to help reduce your urination as well.  ---------------------------   DUE for FASTING BLOOD WORK (no food or drink after midnight before the lab appointment, only water or coffee without cream/sugar on the morning of)  [facility] 6 MONTHS   - Make sure Lab Only appointment is at about 1 week before your next appointment, so that results will be available  For Lab Results, once available within 2-3 days of blood draw, you can can log in to MyChart online to view your results and a brief explanation. Also, we can discuss results at next follow-up visit.   Please schedule a Follow-up Appointment to: Return in about 6 months (around 02/07/2019) for Annual Physical.  If you have any other questions or concerns, please feel free to call the office or send a message through MyChart. You may also schedule an earlier appointment if necessary.  Additionally, you may be receiving a survey about your experience at our office within a few days to 1 week by e-mail or mail. We value your feedback.  Saralyn Pilar, DO [facility] [place], [place]

## 2018-08-08 NOTE — Assessment & Plan Note (Signed)
Consistent clinically with persistent diagnosis BPH, with lower urinary tract symptoms (LUTS) - AUA BPH score 11 > 15 >13 - Last PSA 1.0 (02/2018) - Last DRE 2 year ago reported mild enlarged - No known personal/family history of prostate CA  Plan: 1. Discussed that likely BPH still causing LUTS - medication alpha blocker may only improve the urinary stream may not resolve the urinary frequency - RESTART existing Tamsulosin 0.4mg  daily, advised on benefits, risks, if BP low caution with sudden standing up or position change - MAY DOUBLE DOSE for 0.4mg  x 2 = 0.8mg  if interested to try - Call back if need refills and which dose  Future advised would consider other treatment options such as switch alpha blocker vs finasteride vs Urology referral - because he may need PVR check and consider OAB treatment instead  Also MAY DC HCTZ in his combo pill for BP if this thiazide is causing too much urinary frequency

## 2018-08-08 NOTE — Assessment & Plan Note (Signed)
Well controlled HTN Home reading: reportedly normal No known complication  Plan: 1. Continue current meds: Valsartan -HCTZ 160-25mg  daily, Amlodipine 2.5mg  daily 2. Encouraged improve lifestyle mods with diet and in future can improve regular exercise (as tolerated by knee) 3. Monitor BP outside office 4. Follow-up q 6 months annual / [facility] results  Offered may STOP HCTZ and can increase Amlodipine if too much urinary frequency in future

## 2018-08-08 NOTE — Progress Notes (Signed)
Virtual Visit via Telephone The purpose of this virtual visit is to provide medical care while limiting exposure to the novel coronavirus (COVID19) for both patient and office staff.  Consent was obtained for phone visit:  Yes.   Answered questions that patient had about telehealth interaction:  Yes.   I discussed the limitations, risks, security and privacy concerns of performing an evaluation and management service by telephone. I also discussed with the patient that there may be a patient responsible charge related to this service. The patient expressed understanding and agreed to proceed.  Patient Location: [employer] [place], in [place] Location: [employer] (Office)  ---------------------------------------------------------------------- Chief Complaint  Patient presents with  . Hypertension    S: Reviewed CMA telephone note below. I have called patient and gathered additional HPI as follows:  CHRONIC HTN: - Last visit with me 02/2018, for same problem, treated with continued anti-HTN therapy, see prior notes for background information. - Interval update with BP at home readings 120-130s / 80-85s approx - Today patient reports no new concerns Current meds: Losartan-HCTZ 100-25mg  daily, Amlodipine 2.5mg  Reports good compliance, took meds today. Tolerating well, w/o complaints. Denies CP, dyspnea, HA, edema, dizziness / lightheadedness  BPHwith LUTS - Last visit with me 02/2018, for same problem BPH, treated with new start flomax 0.4mg  for BPH LUTS see score copied below, see prior notes for background information. - Interval update with temporary improvement on flomax seemed to help increase strength of his urinary stream but it did not improve his urinary frequency and urgency difficult to postpone so he stopped it eventually - Today patient reports still has similar urinary BPH LUTS, now asking if other treatment or what to do - Failed Saw  Palmetto in past as well - Has not seen Urologist  AUA BPH Symptom Score over past 1 month 1. Sensation of not emptying bladder post void -2 2. Urinate less than 2 hour after finish last void -4 3. Start/Stop several times during void -1 (morning only) 4. Difficult to postpone urination -3 5. Weak urinary stream -2 6. Push or strain urination -1 7. Nocturia -0times  Last score 02/12/17 - 11 (moderate) Last score 02/28/18 - 15 (moderate) - STARTED FLOMAX  Current 08/08/18 Total Score:13(Moderate BPH symptoms)  Patient is currently in isolation Denies any high risk travel to areas of current concern for COVID19. Denies any known or suspected exposure to person with or possibly with COVID19.  Denies any fevers, chills, sweats, body ache, cough, shortness of breath, sinus pain or pressure, headache, abdominal pain, diarrhea  Past Medical History:  Diagnosis Date  . Infected cuticle 03/08/2015   Left Hand 4th digit   . Obesity    Social History   Tobacco Use  . Smoking status: Never Smoker  . Smokeless tobacco: Never Used  Substance Use Topics  . Alcohol use: No  . Drug use: No    Current Outpatient Medications:  .  amLODipine (NORVASC) 2.5 MG tablet, Take 1 tablet (2.5 mg total) by mouth daily., Disp: 90 tablet, Rfl: 1 .  Multiple Vitamin (MULTIVITAMIN WITH MINERALS) TABS tablet, Take 1 tablet by mouth daily., Disp: , Rfl:  .  polyethylene glycol powder (GLYCOLAX/MIRALAX) powder, Take 17 g by mouth daily as needed., Disp: 250 g, Rfl: 1 .  valsartan-hydrochlorothiazide (DIOVAN-HCT) 160-25 MG tablet, TAKE 1 TABLET BY MOUTH EVERY DAY, Disp: 90 tablet, Rfl: 1 .  tamsulosin (FLOMAX) 0.4 MG CAPS capsule, Take 1 capsule (0.4 mg total) by mouth  daily after breakfast. (Patient not taking: Reported on 08/08/2018), Disp: 30 capsule, Rfl: 2  Depression screen [facility] 2/9 08/08/2018 02/28/2018 02/12/2017  Decreased Interest 0 0 0  Down, Depressed, Hopeless 0 0 0  PHQ - 2 Score 0 0 0     No flowsheet data found.  -------------------------------------------------------------------------- O: No physical exam performed due to remote telephone encounter.  No results found for this or any previous visit (from the past 2160 hour(s)).  -------------------------------------------------------------------------- A&P:  Problem List Items Addressed This Visit    Benign prostatic hyperplasia with weak urinary stream    Consistent clinically with persistent diagnosis BPH, with lower urinary tract symptoms (LUTS) - AUA BPH score 11 > 15 >13 - Last PSA 1.0 (02/2018) - Last DRE 2 year ago reported mild enlarged - No known personal/family history of prostate CA  Plan: 1. Discussed that likely BPH still causing LUTS - medication alpha blocker may only improve the urinary stream may not resolve the urinary frequency - RESTART existing Tamsulosin 0.4mg  daily, advised on benefits, risks, if BP low caution with sudden standing up or position change - MAY DOUBLE DOSE for 0.4mg  x 2 = 0.8mg  if interested to try - Call back if need refills and which dose  Future advised would consider other treatment options such as switch alpha blocker vs finasteride vs Urology referral - because he may need PVR check and consider OAB treatment instead  Also MAY DC HCTZ in his combo pill for BP if this thiazide is causing too much urinary frequency      Essential hypertension - Primary    Well controlled HTN Home reading: reportedly normal No known complication  Plan: 1. Continue current meds: Valsartan -HCTZ 160-25mg  daily, Amlodipine 2.5mg  daily 2. Encouraged improve lifestyle mods with diet and in future can improve regular exercise (as tolerated by knee) 3. Monitor BP outside office 4. Follow-up q 6 months annual / [facility] results  Offered may STOP HCTZ and can increase Amlodipine if too much urinary frequency in future         No orders of the defined types were placed in this  encounter.   Follow-up: - Return in 6 months for Annual Physical, - can return sooner if need for BPH / HTN med adjust - Future labs ordered for [facility]  Patient verbalizes understanding with the above medical recommendations including the limitation of remote medical advice.  Specific follow-up and call-back criteria were given for patient to follow-up or seek medical care more urgently if needed.   - Time spent in direct consultation with patient on phone: 12 minutes  Saralyn Pilar, DO [facility] 08/08/2018, 11:03 AM

## 2018-09-03 ENCOUNTER — Ambulatory Visit: Payer: BLUE CROSS/BLUE SHIELD | Admitting: Family Medicine

## 2018-10-18 ENCOUNTER — Other Ambulatory Visit: Payer: Self-pay | Admitting: Family Medicine

## 2018-10-18 DIAGNOSIS — I10 Essential (primary) hypertension: Secondary | ICD-10-CM

## 2018-11-28 ENCOUNTER — Other Ambulatory Visit: Payer: Self-pay

## 2018-11-28 DIAGNOSIS — I10 Essential (primary) hypertension: Secondary | ICD-10-CM

## 2018-11-28 MED ORDER — VALSARTAN-HYDROCHLOROTHIAZIDE 160-25 MG PO TABS: 1.0000 | ORAL_TABLET | Freq: Every day | ORAL | 1 refills | Status: DC

## 2018-11-28 NOTE — Telephone Encounter (Signed)
Refill request for amlodipine 2.5mg  and losartan-HCTZ 160/25.  [facility]

## 2019-02-13 ENCOUNTER — Encounter: Payer: BLUE CROSS/BLUE SHIELD | Admitting: Family Medicine

## 2019-03-03 ENCOUNTER — Other Ambulatory Visit: Payer: Self-pay | Admitting: Family Medicine

## 2019-03-03 DIAGNOSIS — I10 Essential (primary) hypertension: Secondary | ICD-10-CM

## 2019-03-03 DIAGNOSIS — R3912 Poor urinary stream: Secondary | ICD-10-CM

## 2019-03-03 DIAGNOSIS — Z0000 Encounter for general adult medical examination without abnormal findings: Secondary | ICD-10-CM

## 2019-03-03 DIAGNOSIS — E782 Mixed hyperlipidemia: Secondary | ICD-10-CM

## 2019-03-03 DIAGNOSIS — N401 Enlarged prostate with lower urinary tract symptoms: Secondary | ICD-10-CM

## 2019-03-03 DIAGNOSIS — R7303 Prediabetes: Secondary | ICD-10-CM

## 2019-03-12 ENCOUNTER — Encounter: Payer: Self-pay | Admitting: Family Medicine

## 2019-03-12 ENCOUNTER — Other Ambulatory Visit: Payer: Self-pay

## 2019-03-12 ENCOUNTER — Ambulatory Visit (INDEPENDENT_AMBULATORY_CARE_PROVIDER_SITE_OTHER): Payer: Managed Care, Other (non HMO) | Admitting: Family Medicine

## 2019-03-12 VITALS — BP 130/80 | HR 72 | Temp 98.8°F | Resp 16 | Ht 68.0 in | Wt 269.0 lb

## 2019-03-12 DIAGNOSIS — Z6841 Body Mass Index (BMI) 40.0 and over, adult: Secondary | ICD-10-CM

## 2019-03-12 DIAGNOSIS — E782 Mixed hyperlipidemia: Secondary | ICD-10-CM

## 2019-03-12 DIAGNOSIS — I10 Essential (primary) hypertension: Secondary | ICD-10-CM | POA: Diagnosis not present

## 2019-03-12 DIAGNOSIS — R7303 Prediabetes: Secondary | ICD-10-CM

## 2019-03-12 DIAGNOSIS — Z0000 Encounter for general adult medical examination without abnormal findings: Secondary | ICD-10-CM

## 2019-03-12 DIAGNOSIS — Z23 Encounter for immunization: Secondary | ICD-10-CM | POA: Diagnosis not present

## 2019-03-12 NOTE — Patient Instructions (Addendum)
Thank you for coming to the office today.  You can take the medications together.  Flu shot today  If needed for prostate can take 2 pills of tamsulosin  [facility] A1c blood draw non fasting 1 week before you come in 6 months   Please schedule a Follow-up Appointment to: Return in about 6 months (around 09/09/2019) for 6 month follow-up HTN, PreDM ([facility] result), Weight.  If you have any other questions or concerns, please feel free to call the office or send a message through [place]. You may also schedule an earlier appointment if necessary.  Additionally, you may be receiving a survey about your experience at our office within a few days to 1 week by e-mail or mail. We value your feedback.  Nobie Putnam, DO [place]

## 2019-03-12 NOTE — Assessment & Plan Note (Signed)
Controlled HTN Home reading: reportedly normal No known complication  Plan: 1. Continue current meds: Valsartan -HCTZ 160-25mg  daily, Amlodipine 2.5mg  daily 2. Encouraged improve lifestyle mods with diet and in future can improve regular exercise (as tolerated by knee) 3. Monitor BP outside office  Offered may STOP HCTZ and can increase Amlodipine if too much urinary frequency in future

## 2019-03-12 NOTE — Progress Notes (Signed)
Subjective:    Patient ID: Richard Wheeler, male    DOB: 1965/11/24, 53 y.o.   MRN: [id number]  Richard Wheeler is a 53 y.o. male presenting on 03/12/2019 for Annual Exam   HPI   Here for Annual Physical and lab Review.  CHRONIC HTN / Morbid Obesity BMI >40 - Last visit with me 08/2018, for same problem, treated with continued anti-HTN therapy, see prior notes for background information. - Interval update with BP at home readings 120-130s / 80-85s approx still consistent - Today patient reports no new concerns Current meds: Losartan-HCTZ 100-29m daily, Amlodipine 2.543mReports good compliance, took meds today. Tolerating well, w/o complaints. Denies CP, dyspnea, HA, edema, dizziness / lightheadedness  HYPERLIPIDEMIA: - Reports no concerns. Last lipid panel 02/2018 awaiting next lab result Not on medicine.  BPHwith LUTS Seems improved Last visit 08/2018 documented AUA BPH score of 13 Taking Flomax 0.60m89mot tried double, PRN with good results. - Failed Saw Palmetto in past as well - Has not seen Urologist  Additional updates R Knee surgery workman's comp. Still bothering- awaiting further treatment.   Health Maintenance:  Flu shot given today  Colon CA Screening: Last Colonoscopy 04/09/18 (done by Dr AnnVicente MalesI), results with no polyp, good for 10 years. Currently asymptomatic. No known family history of colon CA. Due for screening test    [facility] 03/12/2019 08/08/2018  Decreased Interest 0 0 0  Down, Depressed, Hopeless 0 0 0  PHQ - 2 Score 0 0 0    Past Medical History:  Diagnosis Date  . Infected cuticle 03/08/2015   Left Hand 4th digit   . Obesity    Past Surgical History:  Procedure Laterality Date  . COLONOSCOPY WITH PROPOFOL N/A 04/09/2018   Procedure: COLONOSCOPY WITH PROPOFOL;  Surgeon: AnnJonathon BellowsD;  [facility];  Service: Gastroenterology;  Laterality: N/A;  . KNEE ARTHROSCOPY WITH MENISCAL REPAIR Right  03/19/2017   Procedure: RIGHT PARTIAL MEDIAL MENISCECTOMY;  Surgeon: PatLeim FabryD;  [facility];  Laterality: Right;  . left knee surgery     Social History   Socioeconomic History  . Marital status: Married    Spouse name: Not on file  . Number of children: Not on file  . Years of education: Not on file  . Highest education level: Not on file  Occupational History  . Not on file  Social Needs  . Financial resource strain: Not on file  . Food insecurity    Worry: Not on file    Inability: Not on file  . Transportation needs    Medical: Not on file    Non-medical: Not on file  Tobacco Use  . Smoking status: Never Smoker  . Smokeless tobacco: Never Used  Substance and Sexual Activity  . Alcohol use: No  . Drug use: No  . Sexual activity: Not on file  Lifestyle  . Physical activity    Days per week: Not on file    Minutes per session: Not on file  . Stress: Not on file  Relationships  . Social conHerbalist phone: Not on file    Gets together: Not on file    Attends religious service: Not on file    Active member of club or organization: Not on file    Attends meetings of clubs or organizations: Not on file    Relationship status: Not on file  . Intimate partner violence  Fear of current or ex partner: Not on file    Emotionally abused: Not on file    Physically abused: Not on file    Forced sexual activity: Not on file  Other Topics Concern  . Not on file  Social History Narrative  . Not on file   Family History  Problem Relation Age of Onset  . Cancer Mother 55  . Multiple myeloma Mother   . Prostate cancer Neg Hx   . Bladder Cancer Neg Hx   . Kidney cancer Neg Hx   . Colon cancer Neg Hx    Current Outpatient Medications on File Prior to Visit  Medication Sig  . amLODipine (NORVASC) 2.5 MG tablet TAKE 1 TABLET BY MOUTH EVERY DAY  . Multiple Vitamin (MULTIVITAMIN WITH MINERALS) TABS tablet Take 1  tablet by mouth daily.  . polyethylene glycol powder (GLYCOLAX/MIRALAX) powder Take 17 g by mouth daily as needed.  . tamsulosin (FLOMAX) 0.4 MG CAPS capsule Take 1 capsule (0.4 mg total) by mouth daily after breakfast.  . valsartan-hydrochlorothiazide (DIOVAN-HCT) 160-25 MG tablet Take 1 tablet by mouth daily.   No current facility-administered medications on file prior to visit.     Review of Systems  Constitutional: Negative for activity change, appetite change, chills, diaphoresis, fatigue and fever.  HENT: Negative for congestion and hearing loss.   Eyes: Negative for visual disturbance.  Respiratory: Negative for apnea, cough, chest tightness, shortness of breath and wheezing.   Cardiovascular: Negative for chest pain, palpitations and leg swelling.  Gastrointestinal: Negative for abdominal pain, anal bleeding, blood in stool, constipation, diarrhea, nausea and vomiting.  Endocrine: Negative for cold intolerance.  Genitourinary: Negative for difficulty urinating, dysuria, frequency and hematuria.  Musculoskeletal: Negative for arthralgias, back pain and neck pain.  Skin: Negative for rash.  Allergic/Immunologic: Negative for environmental allergies.  Neurological: Negative for dizziness, weakness, light-headedness, numbness and headaches.  Hematological: Negative for adenopathy.  Psychiatric/Behavioral: Negative for behavioral problems, dysphoric mood and sleep disturbance. The patient is not nervous/anxious.    Per HPI unless specifically indicated above      Objective:    BP 130/80 (BP Location: Left Arm, Patient Position: Sitting, Cuff Size: Large)   Pulse 72   Temp 98.8 F (37.1 C) (Oral)   Resp 16   Ht _0  (1.727 m)   Wt 269 lb (122 kg)   BMI 40.90 kg/m   Wt Readings from Last 3 Encounters:  03/12/19 269 lb (122 kg)  04/09/18 266 lb 5.1 oz (120.8 kg)  02/28/18 266 lb 6.4 oz (120.8 kg)    Physical Exam Vitals signs and nursing note reviewed.  Constitutional:       General: He is not in acute distress.    Appearance: He is well-developed. He is not diaphoretic.     Comments: Well-appearing, comfortable, cooperative, obese  HENT:     Head: Normocephalic and atraumatic.  Eyes:     General:        Right eye: No discharge.        Left eye: No discharge.     Conjunctiva/sclera: Conjunctivae normal.     Pupils: Pupils are equal, round, and reactive to light.  Neck:     Musculoskeletal: Normal range of motion and neck supple.     Thyroid: No thyromegaly.  Cardiovascular:     Rate and Rhythm: Normal rate and regular rhythm.     Heart sounds: Normal heart sounds. No murmur.  Pulmonary:     Effort: Pulmonary effort  is normal. No respiratory distress.     Breath sounds: Normal breath sounds. No wheezing or rales.  Abdominal:     General: Bowel sounds are normal. There is no distension.     Palpations: Abdomen is soft. There is no mass.     Tenderness: There is no abdominal tenderness.  Musculoskeletal: Normal range of motion.        General: No tenderness.     Comments: Upper / Lower Extremities: - Normal muscle tone, strength bilateral upper extremities 5/5, lower extremities 5/5  Lymphadenopathy:     Cervical: No cervical adenopathy.  Skin:    General: Skin is warm and dry.     Findings: No erythema or rash.  Neurological:     Mental Status: He is alert and oriented to person, place, and time.     Comments: Distal sensation intact to light touch all extremities  Psychiatric:        Behavior: Behavior normal.     Comments: Well groomed, good eye contact, normal speech and thoughts    Results for orders placed or performed in visit on 02/28/18  Hemoglobin A1c  Result Value Ref Range   Hgb A1c MFr Bld 5.9 (H) 4.8 - 5.6 %   Est. average glucose Bld gHb Est-mCnc 123 mg/dL  CBC with Differential/Platelet  Result Value Ref Range   WBC 4.9 3.4 - 10.8 x10E3/uL   RBC 5.13 4.14 - 5.80 x10E6/uL   Hemoglobin 14.0 13.0 - 17.7 g/dL   Hematocrit  42.1 37.5 - 51.0 %   MCV 82 79 - 97 fL   MCH 27.3 26.6 - 33.0 pg   MCHC 33.3 31.5 - 35.7 g/dL   RDW 14.8 12.3 - 15.4 %   Platelets 342 150 - 450 x10E3/uL   Neutrophils 31 Not Estab. %   Lymphs 60 Not Estab. %   Monocytes 7 Not Estab. %   Eos 1 Not Estab. %   Basos 1 Not Estab. %   Neutrophils Absolute 1.5 1.4 - 7.0 x10E3/uL   Lymphocytes Absolute 2.9 0.7 - 3.1 x10E3/uL   Monocytes Absolute 0.4 0.1 - 0.9 x10E3/uL   EOS (ABSOLUTE) 0.1 0.0 - 0.4 x10E3/uL   Basophils Absolute 0.0 0.0 - 0.2 x10E3/uL   Immature Granulocytes 0 Not Estab. %   Immature Grans (Abs) 0.0 0.0 - 0.1 x10E3/uL  Lipid panel  Result Value Ref Range   Cholesterol, Total 175 100 - 199 mg/dL   Triglycerides 127 0 - 149 mg/dL   HDL 33 (L) >39 mg/dL   VLDL Cholesterol Cal 25 5 - 40 mg/dL   LDL Calculated 117 (H) 0 - 99 mg/dL   Chol/HDL Ratio 5.3 (H) 0.0 - 5.0 ratio  PSA  Result Value Ref Range   Prostate Specific Ag, Serum 1.0 0.0 - 4.0 ng/mL  Comprehensive metabolic panel  Result Value Ref Range   Glucose 81 65 - 99 mg/dL   BUN 11 6 - 24 mg/dL   Creatinine, Ser 1.01 0.76 - 1.27 mg/dL   GFR calc non Af Amer 86 >59 mL/min/1.73   GFR calc Af Amer 100 >59 mL/min/1.73   BUN/Creatinine Ratio 11 9 - 20   Sodium 139 134 - 144 mmol/L   Potassium 4.5 3.5 - 5.2 mmol/L   Chloride 99 96 - 106 mmol/L   CO2 24 20 - 29 mmol/L   Calcium 9.7 8.7 - 10.2 mg/dL   Total Protein 7.8 6.0 - 8.5 g/dL   Albumin 4.6 3.5 - 5.5 g/dL  Globulin, Total 3.2 1.5 - 4.5 g/dL   Albumin/Globulin Ratio 1.4 1.2 - 2.2   Bilirubin Total 0.4 0.0 - 1.2 mg/dL   Alkaline Phosphatase 71 39 - 117 IU/L   AST 36 0 - 40 IU/L   ALT 40 0 - 44 IU/L      Assessment & Plan:   Problem List Items Addressed This Visit    Pre-diabetes    Previously mild elevated A1c 5.9 =- waiting new A1c lab result now Concern with obesity, HTN, HLD  Plan:  1. Not on any therapy currently  2. Encourage improved lifestyle - low carb, low sugar diet, reduce portion size,  continue improving regular exercise in future as tolerated by knee  Repeat [facility] A1c in 6 months      Morbid obesity with BMI of 40.0-44.9, adult (HCC)    Weight slightly increased Improving lifestyle diet exercise      Hyperlipidemia    Awaiting next lipid panel results Previously mild elevated LDL >110 Last lipid panel 2019 Calculated ASCVD 10 yr risk score 7.6%  Plan: 1. Offer Fish oil as discussed 2. Encourage improved lifestyle - low carb/cholesterol, reduce portion size, continue improving regular exercise as tolerated with knee      Essential hypertension    Controlled HTN Home reading: reportedly normal No known complication  Plan: 1. Continue current meds: Valsartan -HCTZ 160-38m daily, Amlodipine 2.538mdaily 2. Encouraged improve lifestyle mods with diet and in future can improve regular exercise (as tolerated by knee) 3. Monitor BP outside office  Offered may STOP HCTZ and can increase Amlodipine if too much urinary frequency in future       Other Visit Diagnoses    Annual physical exam    -  Primary   Needs flu shot       Relevant Orders   [place] Flu Vaccine QUAD 36+ mos PF IM (Fluarix & Fluzone Quad PF) (Completed)      Updated Health Maintenance information Reviewed recent lab results with patient Encouraged improvement to lifestyle with diet and exercise - Goal of weight loss   No orders of the defined types were placed in this encounter.   Follow up plan:  Return in about 6 months (around 09/09/2019) for 6 month follow-up HTN, PreDM ([facility] result), Weight.   Future [facility] A1c in 6 months  AlNobie PutnamDO [facility] 03/12/2019, 9:53 AM

## 2019-03-12 NOTE — Assessment & Plan Note (Signed)
Previously mild elevated A1c 5.9 =- waiting new A1c lab result now Concern with obesity, HTN, HLD  Plan:  1. Not on any therapy currently  2. Encourage improved lifestyle - low carb, low sugar diet, reduce portion size, continue improving regular exercise in future as tolerated by knee  Repeat [facility] A1c in 6 months

## 2019-03-12 NOTE — Assessment & Plan Note (Signed)
Weight slightly increased Improving lifestyle diet exercise

## 2019-03-12 NOTE — Assessment & Plan Note (Signed)
Awaiting next lipid panel results Previously mild elevated LDL >110 Last lipid panel 2019 Calculated ASCVD 10 yr risk score 7.6%  Plan: 1. Offer Fish oil as discussed 2. Encourage improved lifestyle - low carb/cholesterol, reduce portion size, continue improving regular exercise as tolerated with knee

## 2019-03-13 LAB — COMPREHENSIVE METABOLIC PANEL
ALT: 32 IU/L (ref 0–44)
AST: 30 IU/L (ref 0–40)
Albumin/Globulin Ratio: 1.2 (ref 1.2–2.2)
Albumin: 4.3 g/dL (ref 3.8–4.9)
Alkaline Phosphatase: 91 IU/L (ref 39–117)
BUN/Creatinine Ratio: 12 (ref 9–20)
BUN: 11 mg/dL (ref 6–24)
Bilirubin Total: 0.3 mg/dL (ref 0.0–1.2)
CO2: 22 mmol/L (ref 20–29)
Calcium: 9.8 mg/dL (ref 8.7–10.2)
Chloride: 99 mmol/L (ref 96–106)
Creatinine, Ser: 0.89 mg/dL (ref 0.76–1.27)
GFR calc Af Amer: 114 mL/min/{1.73_m2} (ref >59)
GFR calc non Af Amer: 99 mL/min/{1.73_m2} (ref >59)
Globulin, Total: 3.5 g/dL (ref 1.5–4.5)
Glucose: 98 mg/dL (ref 65–99)
Potassium: 4.9 mmol/L (ref 3.5–5.2)
Sodium: 136 mmol/L (ref 134–144)
Total Protein: 7.8 g/dL (ref 6.0–8.5)

## 2019-03-13 LAB — CBC WITH DIFFERENTIAL/PLATELET
Basophils Absolute: 0 10*3/uL (ref 0.0–0.2)
Basos: 1 %
EOS (ABSOLUTE): 0.1 10*3/uL (ref 0.0–0.4)
Eos: 2 %
Hematocrit: 44.2 % (ref 37.5–51.0)
Hemoglobin: 14.9 g/dL (ref 13.0–17.7)
Immature Grans (Abs): 0 10*3/uL (ref 0.0–0.1)
Immature Granulocytes: 0 %
Lymphocytes Absolute: 3.1 10*3/uL (ref 0.7–3.1)
Lymphs: 53 %
MCH: 28.1 pg (ref 26.6–33.0)
MCHC: 33.7 g/dL (ref 31.5–35.7)
MCV: 83 fL (ref 79–97)
Monocytes Absolute: 0.5 10*3/uL (ref 0.1–0.9)
Monocytes: 9 %
Neutrophils Absolute: 2 10*3/uL (ref 1.4–7.0)
Neutrophils: 35 %
Platelets: 353 10*3/uL (ref 150–450)
RBC: 5.3 x10E6/uL (ref 4.14–5.80)
RDW: 14.9 % (ref 11.6–15.4)
WBC: 5.7 10*3/uL (ref 3.4–10.8)

## 2019-03-13 LAB — LIPID PANEL
Chol/HDL Ratio: 4.8 ratio (ref 0.0–5.0)
Cholesterol, Total: 162 mg/dL (ref 100–199)
HDL: 34 mg/dL — ABNORMAL LOW (ref >39)
LDL Chol Calc (NIH): 104 mg/dL — ABNORMAL HIGH (ref 0–99)
Triglycerides: 131 mg/dL (ref 0–149)
VLDL Cholesterol Cal: 24 mg/dL (ref 5–40)

## 2019-03-13 LAB — HEMOGLOBIN A1C
Est. average glucose Bld gHb Est-mCnc: 114 mg/dL
Hgb A1c MFr Bld: 5.6 % (ref 4.8–5.6)

## 2019-03-13 LAB — PSA: Prostate Specific Ag, Serum: 1.8 ng/mL (ref 0.0–4.0)

## 2019-03-30 ENCOUNTER — Other Ambulatory Visit: Payer: Self-pay | Admitting: Family Medicine

## 2019-03-30 DIAGNOSIS — R7303 Prediabetes: Secondary | ICD-10-CM

## 2019-07-01 ENCOUNTER — Other Ambulatory Visit: Payer: Self-pay

## 2019-07-01 ENCOUNTER — Encounter: Payer: Self-pay | Admitting: Family Medicine

## 2019-07-01 ENCOUNTER — Ambulatory Visit (INDEPENDENT_AMBULATORY_CARE_PROVIDER_SITE_OTHER): Payer: Managed Care, Other (non HMO) | Admitting: Family Medicine

## 2019-07-01 VITALS — BP 145/78 | HR 86 | Temp 97.5°F | Resp 16 | Ht 67.0 in | Wt 274.0 lb

## 2019-07-01 DIAGNOSIS — M25561 Pain in right knee: Secondary | ICD-10-CM

## 2019-07-01 DIAGNOSIS — G8929 Other chronic pain: Secondary | ICD-10-CM | POA: Diagnosis not present

## 2019-07-01 DIAGNOSIS — M1711 Unilateral primary osteoarthritis, right knee: Secondary | ICD-10-CM

## 2019-07-01 NOTE — Progress Notes (Signed)
Subjective:    Patient ID: Richard Wheeler, male    DOB: 28-Aug-1965, 54 y.o.   MRN: [id number]  [name] is a 54 y.o. male presenting on 07/01/2019 for Knee Pain (Right side 3-4 months)   HPI   Chronic R Knee Pain / Osteoarthritis - Worker's comp R knee injury 05/2018 - Arthroscopic knee surgery meniscus repair Dr Odis Luster [facility] 08/2018 - Prior history of other knee arthroscopy back in 2018 from [facility] - Previous treatment with Meloxicam oral, Voltaren topical 3 x daily - Currently still with R knee pain. Worse with inc ambulation. He is able to walk on it but worse if stiff and sore if seated prolonged and improves with walking initially. - He is asking about FMLA for R knee pain osteoarthritis Denies new injury, numbness tingling joint swelling redness fever chills   Depression screen [facility] 2/9 07/01/2019 03/12/2019 03/12/2019  Decreased Interest 0 0 0  Down, Depressed, Hopeless 0 0 0  PHQ - 2 Score 0 0 0    Social History   Tobacco Use  . Smoking status: Never Smoker  . Smokeless tobacco: Never Used  Substance Use Topics  . Alcohol use: No  . Drug use: No    Review of Systems Per HPI unless specifically indicated above     Objective:    BP (!) 145/78   Pulse 86   Temp (!) 97.5 F (36.4 C) (Temporal)   Resp 16   Ht 5\' 7"  (1.702 m)   Wt 274 lb (124.3 kg)   BMI 42.91 kg/m   Wt Readings from Last 3 Encounters:  07/01/19 274 lb (124.3 kg)  03/12/19 269 lb (122 kg)  04/09/18 266 lb 5.1 oz (120.8 kg)    Physical Exam Vitals and nursing note reviewed.  Constitutional:      General: He is not in acute distress.    Appearance: He is well-developed. He is not diaphoretic.     Comments: Well-appearing, comfortable, cooperative  HENT:     Head: Normocephalic and atraumatic.  Eyes:     General:        Right eye: No discharge.        Left eye: No discharge.     Conjunctiva/sclera: Conjunctivae normal.  Cardiovascular:     Rate and Rhythm: Normal  rate.  Pulmonary:     Effort: Pulmonary effort is normal.  Musculoskeletal:     Comments: Right Knee Inspection: Normal appearance and symmetrical. No ecchymosis or effusion. Palpation: Non-tender. No crepitus bilateral, mild tender lateral joint line and lower R side ROM: Full active ROM bilaterally Special Testing: Lachman / Valgus/Varus tests negative with intact ligaments (ACL, MCL, LCL). Strength: 5/5 intact knee flex/ext, ankle dorsi/plantarflex Neurovascular: distally intact sensation light touch and pulses   Skin:    General: Skin is warm and dry.     Findings: No erythema or rash.  Neurological:     Mental Status: He is alert and oriented to person, place, and time.  Psychiatric:        Behavior: Behavior normal.     Comments: Well groomed, good eye contact, normal speech and thoughts    Results for orders placed or performed in visit on 03/03/19  Comprehensive metabolic panel  Result Value Ref Range   Glucose 98 65 - 99 mg/dL   BUN 11 6 - 24 mg/dL   Creatinine, Ser 03/05/19 0.76 - 1.27 mg/dL   GFR calc non Af Amer 99 >59 mL/min/1.73   GFR calc  Af Amer 114 >59 mL/min/1.73   BUN/Creatinine Ratio 12 9 - 20   Sodium 136 134 - 144 mmol/L   Potassium 4.9 3.5 - 5.2 mmol/L   Chloride 99 96 - 106 mmol/L   CO2 22 20 - 29 mmol/L   Calcium 9.8 8.7 - 10.2 mg/dL   Total Protein 7.8 6.0 - 8.5 g/dL   Albumin 4.3 3.8 - 4.9 g/dL   Globulin, Total 3.5 1.5 - 4.5 g/dL   Albumin/Globulin Ratio 1.2 1.2 - 2.2   Bilirubin Total 0.3 0.0 - 1.2 mg/dL   Alkaline Phosphatase 91 39 - 117 IU/L   AST 30 0 - 40 IU/L   ALT 32 0 - 44 IU/L  PSA  Result Value Ref Range   Prostate Specific Ag, Serum 1.8 0.0 - 4.0 ng/mL  Lipid panel  Result Value Ref Range   Cholesterol, Total 162 100 - 199 mg/dL   Triglycerides 131 0 - 149 mg/dL   HDL 34 (L) >39 mg/dL   VLDL Cholesterol Cal 24 5 - 40 mg/dL   LDL Chol Calc (NIH) 104 (H) 0 - 99 mg/dL   Chol/HDL Ratio 4.8 0.0 - 5.0 ratio  CBC with  Differential/Platelet  Result Value Ref Range   WBC 5.7 3.4 - 10.8 x10E3/uL   RBC 5.30 4.14 - 5.80 x10E6/uL   Hemoglobin 14.9 13.0 - 17.7 g/dL   Hematocrit 44.2 37.5 - 51.0 %   MCV 83 79 - 97 fL   MCH 28.1 26.6 - 33.0 pg   MCHC 33.7 31.5 - 35.7 g/dL   RDW 14.9 11.6 - 15.4 %   Platelets 353 150 - 450 x10E3/uL   Neutrophils 35 Not Estab. %   Lymphs 53 Not Estab. %   Monocytes 9 Not Estab. %   Eos 2 Not Estab. %   Basos 1 Not Estab. %   Neutrophils Absolute 2.0 1.4 - 7.0 x10E3/uL   Lymphocytes Absolute 3.1 0.7 - 3.1 x10E3/uL   Monocytes Absolute 0.5 0.1 - 0.9 x10E3/uL   EOS (ABSOLUTE) 0.1 0.0 - 0.4 x10E3/uL   Basophils Absolute 0.0 0.0 - 0.2 x10E3/uL   Immature Granulocytes 0 Not Estab. %   Immature Grans (Abs) 0.0 0.0 - 0.1 x10E3/uL  Hemoglobin A1c  Result Value Ref Range   Hgb A1c MFr Bld 5.6 4.8 - 5.6 %   Est. average glucose Bld gHb Est-mCnc 114 mg/dL      Assessment & Plan:   Problem List Items Addressed This Visit    Primary osteoarthritis of right knee - Primary    Other Visit Diagnoses    Chronic pain of right knee          Subacute on chronic R lateral vs generalized Knee pain and swelling Prior injury at work, followed by Ortho Dr Harlow Mares on [facility] Comp case S/p R knee 08/2018 arthroscopic knee surgery meniscus repair Now worsening pain R knee again he will return to Ortho - Able to bear weight, no knee instability, mechanical locking Failed conservative therapy  Plan: Agree to complete FMLA if requested. He will continue w/ Workman's comp through ortho as planned for further management.  No reduced hours Intermittent leave 2 x flare up a month 1-3 days per flare 6 month duration    No orders of the defined types were placed in this encounter.     Follow up plan: Return if symptoms worsen or fail to improve.   Nobie Putnam, [facility] 07/01/2019,  3:07 PM

## 2019-07-01 NOTE — Patient Instructions (Addendum)
Thank you for coming to the office today.  I can complete the FMLA paperwork if it is sent to our office  Follow back up with Dr Odis Luster, let them know knee is not resolved. Can consider injection or MRI or other options.   Please schedule a Follow-up Appointment to: Return if symptoms worsen or fail to improve.  If you have any other questions or concerns, please feel free to call the office or send a message through MyChart. You may also schedule an earlier appointment if necessary.  Additionally, you may be receiving a survey about your experience at our office within a few days to 1 week by e-mail or mail. We value your feedback.  Saralyn Pilar, DO [facility], [place]

## 2019-07-03 ENCOUNTER — Other Ambulatory Visit: Payer: Self-pay | Admitting: Family Medicine

## 2019-07-03 DIAGNOSIS — I10 Essential (primary) hypertension: Secondary | ICD-10-CM

## 2019-07-12 IMAGING — MR MR KNEE*R* W/O CM
6 series · 36 of 40 positions shown · non-contrast
Comparison: None.

CLINICAL DATA: Right knee injury playing softball 3 months ago.
Medial and lateral swelling with difficulty walking. No previous
relevant surgery.

EXAM:
MRI OF THE RIGHT KNEE WITHOUT CONTRAST
TECHNIQUE: Multiplanar, multisequence MR imaging of the knee was performed. No
intravenous contrast was administered.

[Series 3: PD fat-sat · axial · 3.0mm · 0.50mm/px · z∈[-57,+72]mm · 8 of 40 slices shown (1 of 4)]
[im 1/40]
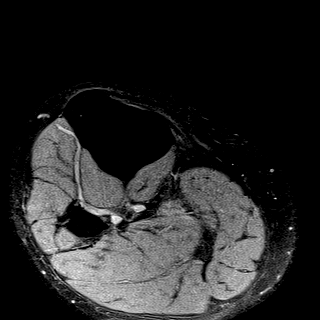
[im 6/40]
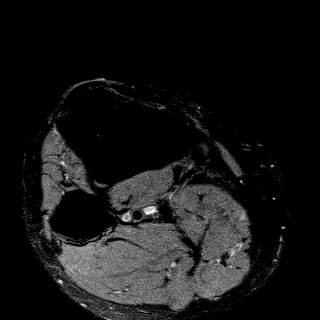
[im 12/40]
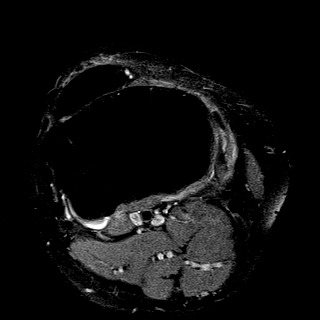
[im 17/40]
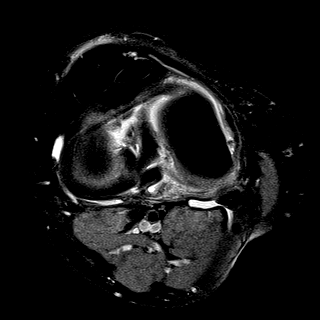
[im 23/40]
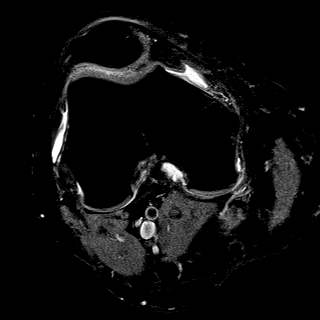
[im 28/40]
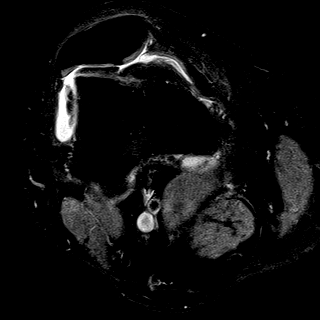
[im 34/40]
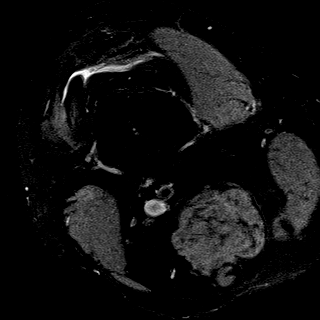
[im 40/40]
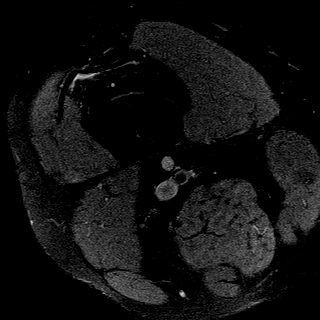

[Series 4: T1 · coronal · 3.0mm · 0.56mm/px · 3 of 33 slices shown]
[im 1/33]
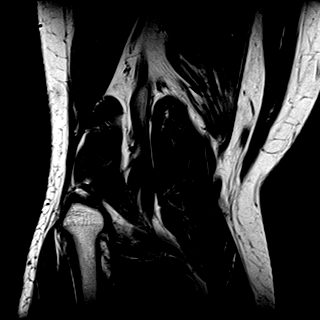
[im 6/33]
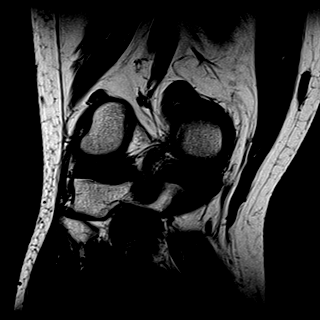
[im 11/33]
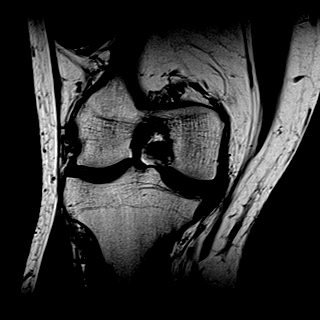

[Series 5: PD fat-sat · sagittal · 3.0mm · 0.56mm/px · 8 of 39 slices shown (2 of 4)]
[im 1/39]
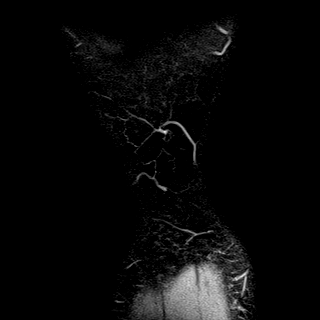
[im 6/39]
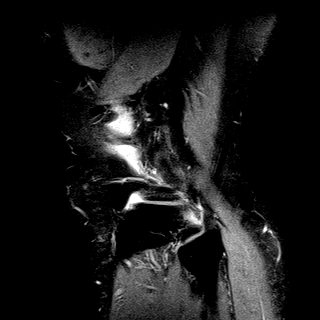
[im 11/39]
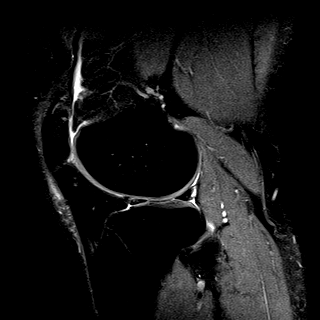
[im 17/39]
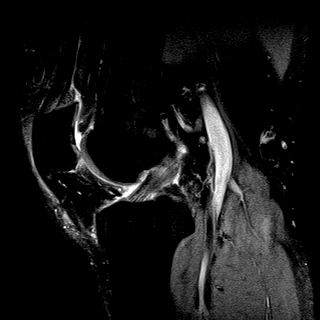
[im 22/39]
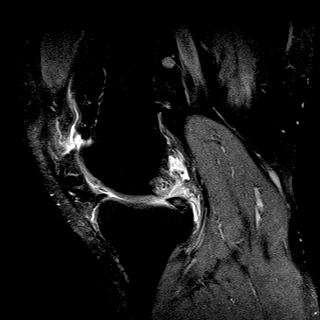
[im 28/39]
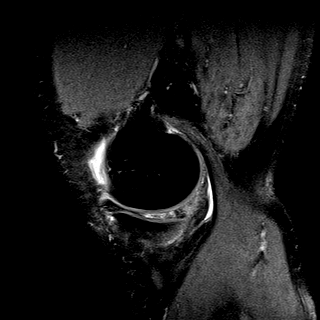
[im 33/39]
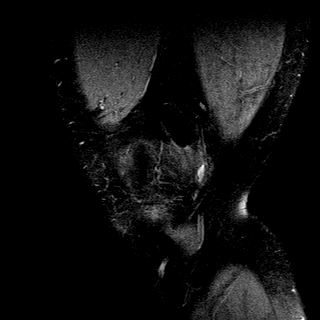
[im 39/39]
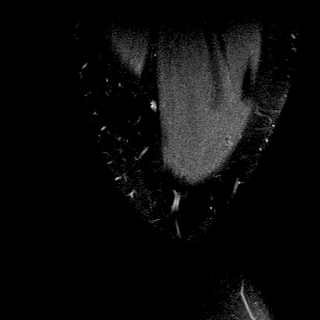

[Series 6: T2 fat-sat · coronal · 3.0mm · 0.35mm/px · 7 of 33 slices shown]
[im 1/33]
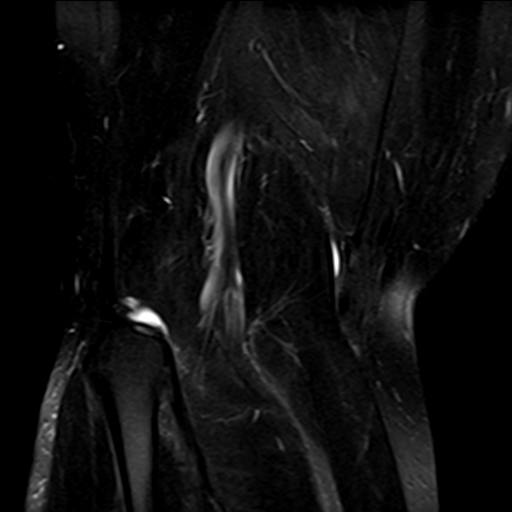
[im 6/33]
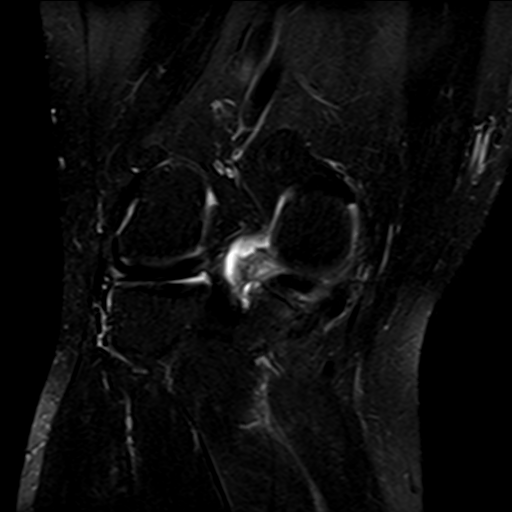
[im 11/33]
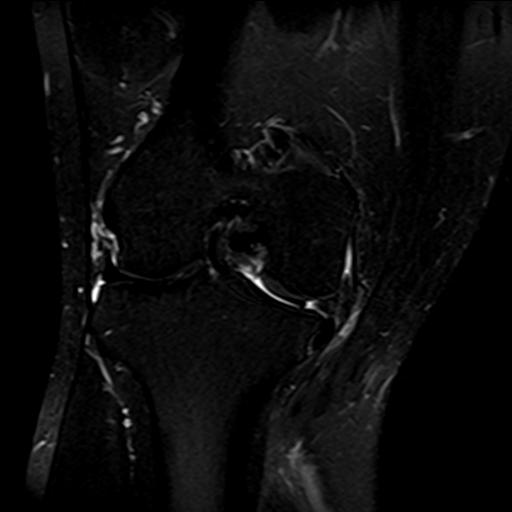
[im 17/33]
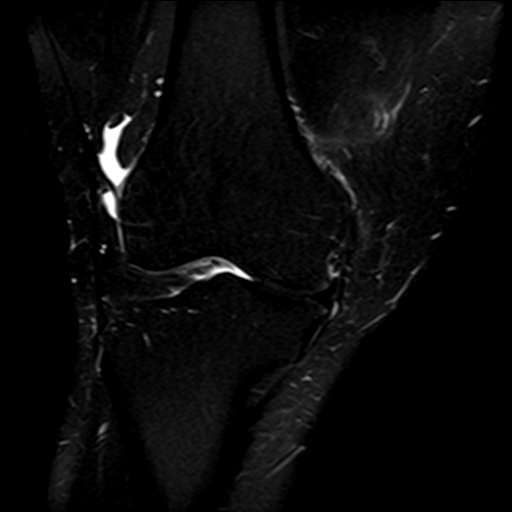
[im 22/33]
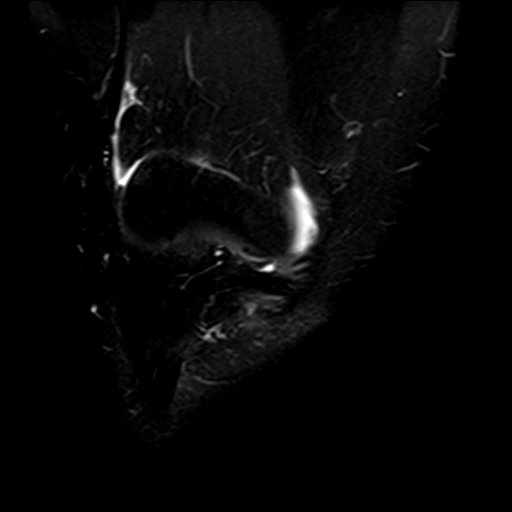
[im 27/33]
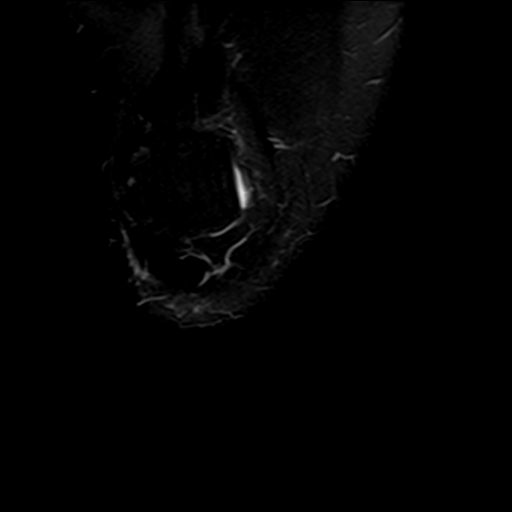
[im 33/33]
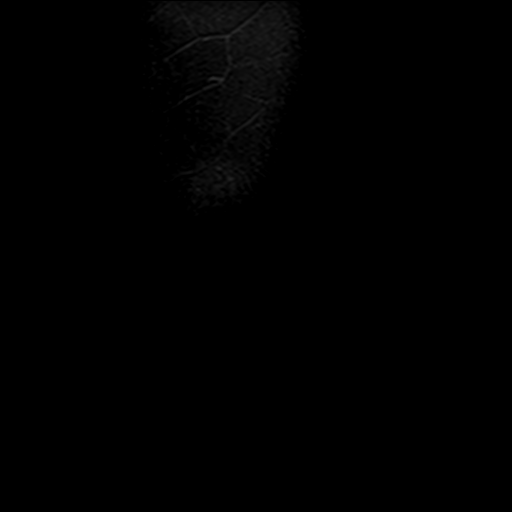

[Series 7: PD fat-sat · coronal · 3.0mm · 0.56mm/px · 7 of 33 slices shown (3 of 4)]
[im 1/33]
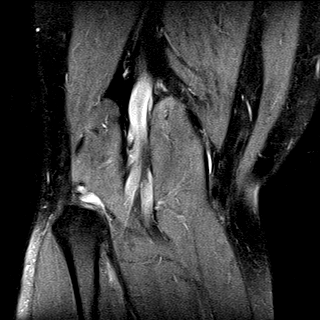
[im 6/33]
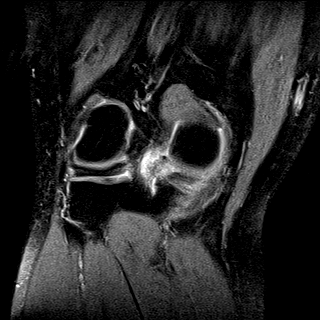
[im 11/33]
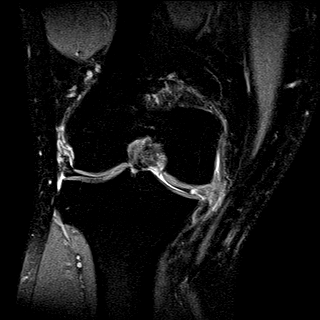
[im 17/33]
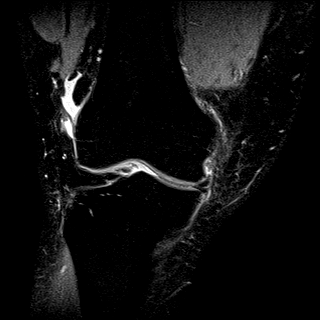
[im 22/33]
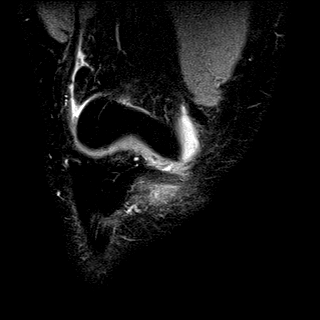
[im 27/33]
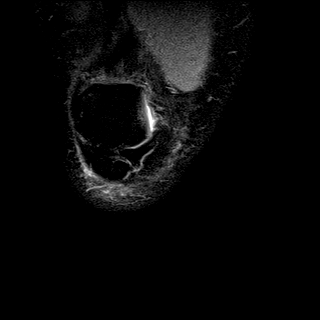
[im 33/33]
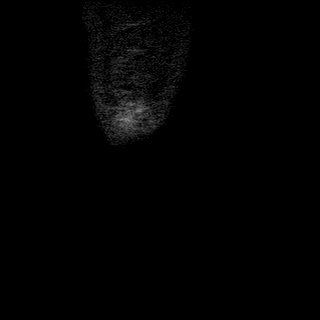

[Series 8: PD fat-sat · oblique · 2.0mm · 0.62mm/px · 3 of 17 slices shown (4 of 4)]
[im 1/17]
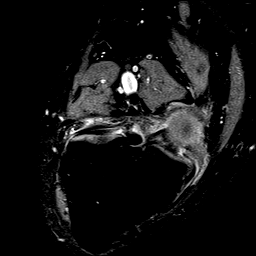
[im 9/17]
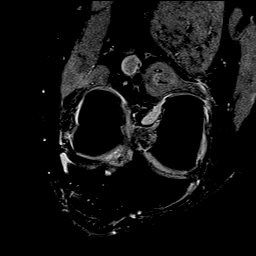
[im 17/17]
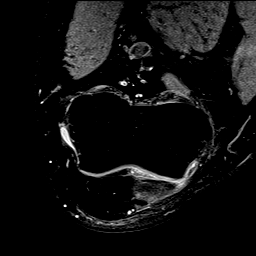

[36 of 40 positions shown; findings below may reference images not displayed]

FINDINGS: MENISCI

Medial meniscus: There is a complex tear of the posterior horn and
body. The meniscus is partially extruded from the joint and appears
macerated. The meniscal root is intact. No centrally displaced
meniscal fragment identified.

Lateral meniscus:  Intact with normal morphology.

LIGAMENTS

Cruciates:  Intact.

Collaterals: Intact. The medial collateral ligament is degenerated
and bowed medially.

CARTILAGE

Patellofemoral: Mild chondral thinning and surface irregularity with
mild subchondral cyst formation in the lateral facet and apex.

Medial:  Minimal chondral thinning.  No focal defect.

Lateral:  Preserved.

MISCELLANEOUS

Joint:  No significant joint effusion.

Popliteal Fossa:  Tiny Baker's cyst.

Extensor Mechanism: Intact. Possible mild distal quadriceps
tendinosis. Hoffa's fat appears unremarkable.

Bones:  No acute or significant extra-articular osseous findings.

Other: No significant periarticular soft tissue findings.
IMPRESSION: 1. Complex tear of the body and posterior horn of the medial
meniscus.
2. The lateral meniscus, cruciate and collateral ligaments are
intact. Mild MCL degeneration.
3. Mild degenerative changes, greatest in the patellofemoral
compartment. No acute osseous findings.

## 2019-08-07 ENCOUNTER — Encounter: Payer: Self-pay | Admitting: Family Medicine

## 2019-08-07 NOTE — Progress Notes (Signed)
FMLA Form Completion: Received FMLA paperwork for patient initial request   Company: [employer]  Part A: Medical Facts: 1. Approximate date of onset for condition requesting leave - 07/01/19 - Probable duration of patient's condition: 6 months - Not hospitalized - Yes treatment >2x yearly - Yes rx medications - Yes referral for current episode - Dr Signa Kell MD Orthopedic Surgery [phone] 2. Pregnancy - No 3. Unable to perform job - Yes 4. Relevant medical facts - chronic R knee pain and osteoarthritis right knee, limiting ambulation, and function due to pain, treated with steroid injection R knee, medication, x-ray, physical therapy.  Part B: Amount of Leave Needed 5. Continuous Leave - No  6. Follow-up - Yes       - Frequency 1 appointment every 1 month       - Duration 1 day per appointment 7. Episodic flare-ups - Yes.       - Frequency 2 time per 1 month       - Duration 1-3 days per episode 8. Reduced Schedule / Part Time - No  Completed, signed, and dated FMLA paperwork . To be scanned into chart and submitted via fax.  Saralyn Pilar, DO [facility] 08/07/2019, 1:27 PM

## 2020-02-07 ENCOUNTER — Other Ambulatory Visit: Payer: Self-pay | Admitting: Family Medicine

## 2020-02-07 DIAGNOSIS — I10 Essential (primary) hypertension: Secondary | ICD-10-CM

## 2020-02-07 NOTE — Telephone Encounter (Signed)
Requested Prescriptions  Pending Prescriptions Disp Refills  . valsartan-hydrochlorothiazide (DIOVAN-HCT) 160-25 MG tablet [Pharmacy Med Name: VALSARTAN-HCTZ 160-25 MG TAB] 90 tablet 0    Sig: TAKE 1 TABLET BY MOUTH EVERY DAY     Cardiovascular: ARB + Diuretic Combos Failed - 02/07/2020  4:45 PM      Failed - K in normal range and within 180 days    Potassium  Date Value Ref Range Status  03/12/2019 4.9 3.5 - 5.2 mmol/L Final         Failed - Na in normal range and within 180 days    Sodium  Date Value Ref Range Status  03/12/2019 136 134 - 144 mmol/L Final         Failed - Cr in normal range and within 180 days    Creatinine, Ser  Date Value Ref Range Status  03/12/2019 0.89 0.76 - 1.27 mg/dL Final         Failed - Ca in normal range and within 180 days    Calcium  Date Value Ref Range Status  03/12/2019 9.8 8.7 - 10.2 mg/dL Final         Failed - Last BP in normal range    BP Readings from Last 1 Encounters:  07/01/19 (!) 145/78         Failed - Valid encounter within last 6 months    Recent Outpatient Visits          7 months ago Primary osteoarthritis of right knee   [facility] Smitty Cords, DO   11 months ago Annual physical exam   [facility] Smitty Cords, DO   1 year ago Essential hypertension   [facility] Smitty Cords, DO   1 year ago Annual physical exam   [facility] Smitty Cords, DO   2 years ago Annual physical exam   [facility] Smitty Cords, DO      Future Appointments            In 2 weeks Althea Charon, Netta Neat, DO [facility], [facility]           Passed - Patient is not pregnant

## 2020-02-22 ENCOUNTER — Other Ambulatory Visit: Payer: Self-pay

## 2020-02-22 ENCOUNTER — Ambulatory Visit (INDEPENDENT_AMBULATORY_CARE_PROVIDER_SITE_OTHER): Payer: BC Managed Care – PPO | Admitting: Family Medicine

## 2020-02-22 ENCOUNTER — Encounter: Payer: Self-pay | Admitting: Family Medicine

## 2020-02-22 VITALS — BP 137/63 | HR 87 | Temp 96.8°F | Resp 16 | Ht 68.0 in | Wt 282.6 lb

## 2020-02-22 DIAGNOSIS — Z1159 Encounter for screening for other viral diseases: Secondary | ICD-10-CM

## 2020-02-22 DIAGNOSIS — Z0000 Encounter for general adult medical examination without abnormal findings: Secondary | ICD-10-CM

## 2020-02-22 DIAGNOSIS — N401 Enlarged prostate with lower urinary tract symptoms: Secondary | ICD-10-CM | POA: Diagnosis not present

## 2020-02-22 DIAGNOSIS — Z23 Encounter for immunization: Secondary | ICD-10-CM | POA: Diagnosis not present

## 2020-02-22 DIAGNOSIS — R7303 Prediabetes: Secondary | ICD-10-CM

## 2020-02-22 DIAGNOSIS — Z6841 Body Mass Index (BMI) 40.0 and over, adult: Secondary | ICD-10-CM

## 2020-02-22 DIAGNOSIS — R3912 Poor urinary stream: Secondary | ICD-10-CM

## 2020-02-22 DIAGNOSIS — I10 Essential (primary) hypertension: Secondary | ICD-10-CM

## 2020-02-22 DIAGNOSIS — E782 Mixed hyperlipidemia: Secondary | ICD-10-CM

## 2020-02-22 MED ORDER — AMLODIPINE BESYLATE 2.5 MG PO TABS: 2.5000 mg | ORAL_TABLET | Freq: Every day | ORAL | 3 refills | Status: DC

## 2020-02-22 MED ORDER — TAMSULOSIN HCL 0.4 MG PO CAPS: 0.4000 mg | ORAL_CAPSULE | Freq: Every day | ORAL | 1 refills | Status: DC

## 2020-02-22 MED ORDER — VALSARTAN-HYDROCHLOROTHIAZIDE 160-25 MG PO TABS: 1.0000 | ORAL_TABLET | Freq: Every day | ORAL | 3 refills | Status: DC

## 2020-02-22 NOTE — Patient Instructions (Addendum)
Thank you for coming to the office today.  Flu shot today  Stay tuned for Moderna COVID booster when eligible.  DUE for FASTING BLOOD WORK (no food or drink after midnight before the lab appointment, only water or coffee without cream/sugar on the morning of)  [facility] Stay tuned for results.  Recent Labs    03/12/19 0838  HGBA1C 5.6    Limit caffeine and extra fluid intake  Can HOLD off on OTC prostate med  RESTART Flomax (Tamsulosin) 0.4mg  daily - after few days or weeks, if not quite strong enough - you can try taking TWO pills at once. Let me know if this works and we can order more.  If still need help with prostate - we can refer to Urologist.    Please schedule a Follow-up Appointment to: Return in about 6 months (around 08/22/2020) for 6 month follow-up PreDM A1c, HTN, Weight.  If you have any other questions or concerns, please feel free to call the office or send a message through MyChart. You may also schedule an earlier appointment if necessary.  Additionally, you may be receiving a survey about your experience at our office within a few days to 1 week by e-mail or mail. We value your feedback.  Saralyn Pilar, DO [facility], [place]

## 2020-02-22 NOTE — Assessment & Plan Note (Signed)
Gradual worsening symptoms Consistent clinically with persistent diagnosis BPH, with lower urinary tract symptoms (LUTS) - AUA BPH score 11-15 in past - Last PSA 1.8 (2020) - due today for lab - Last DRE 2 year ago reported mild enlarged - No known personal/family history of prostate CA  Plan: 1. Discussed that likely BPH still causing LUTS - medication alpha blocker may only improve the urinary stream may not resolve the urinary frequency - RESTART existing Tamsulosin 0.4mg  daily, advised on benefits, risks, if BP low caution with sudden standing up or position change - MAY DOUBLE DOSE for 0.4mg  x 2 = 0.8mg  if interested to try - Call back if need refills and which dose  Future advised would consider other treatment options such as switch alpha blocker vs finasteride vs Urology referral - because he may need PVR check and consider OAB treatment instead  Also MAY DC HCTZ in his combo pill for BP if this thiazide is causing too much urinary frequency

## 2020-02-22 NOTE — Assessment & Plan Note (Signed)
Due for repeat fasting lipid now, orders in Previously mild elevated LDL 110 > 104 Last lipid panel 2020 The 10-year ASCVD risk score Denman George DC Jr., et al., 2013) is: 11.5%  Plan: 1. Consider Statin therapy reduce ASCVD risk 2. Encourage improved lifestyle - low carb/cholesterol, reduce portion size, continue improving regular exercise as tolerated with knee  Will check lipid and follow-up based on results

## 2020-02-22 NOTE — Assessment & Plan Note (Signed)
A1c previously improved to 5.6, due for repeat lab Concern with obesity, HTN, HLD  Plan:  1. Not on any therapy currently  2. Encourage improved lifestyle - low carb, low sugar diet, reduce portion size, continue improving regular exercise in future as tolerated by knee  F/u 6 month

## 2020-02-22 NOTE — Assessment & Plan Note (Signed)
Still abnormal weight increase. Encourage lifestyle diet exercise

## 2020-02-22 NOTE — Progress Notes (Signed)
Subjective:    Patient ID: Richard Wheeler, male    DOB: Jan 06, 1966, 54 y.o.   MRN: [id number]  Richard Wheeler is a 54 y.o. male presenting on 02/22/2020 for Annual Exam   HPI   Here for Annual Physical and lab Review.  CHRONIC HTN / Morbid Obesity BMI >42 - Last visit with me for annual 03/2019 - Interval update withBP at home readings in normal range. - Today patient reportsno new concerns Current meds: Valsartan-HCTZ 160-42m daily, Amlodipine 2.539m- recently got refill Reports good compliance, took meds today. Tolerating well, w/o complaints. Lifestyle - Weight gain 8 lbs in 8 months - Diet: not always following appropriate diet - goal to improve - Exercise: goal to improve exercise regimen, previous Knee pain issue. Denies CP, dyspnea, HA, edema, dizziness / lightheadedness  HYPERLIPIDEMIA: - Reports no concerns. Last lipid panel 02/2019 showed improved LDL to 104. Due for repeat lipids now Not on medicine statin  BPHwith LUTS Worsening BPH symptoms now, he is off Flomax since 2019/2020 approx He was on OTC prostate supplement PRN with mixed results - Has not seen Urologist He is interested to resume prostate medicine Due for PSA lab  Health Maintenance:  Due for Flu Shot, will receive today   UTD COVID19 vaccine Moderna vaccine, last dose 08/17/19  Colon CA Screening: Last Colonoscopy 04/09/18 (done by Dr AnVicente MalesGI), results with no polyp, good for 10 years - next due 2029. Currently asymptomatic. No known family history of colon CA. Due for screening test   Prostate CA Screening: Last DRE mild enlarged 2018. Last PSA 1.8 (03/2019, prior results 1.1 to 1.0 years 2018 and 2019). Currently some worsening BPH LUTS. No known family history of prostate CA.  - Due for screening lab PSA will order now.   [facility] 02/22/2020 07/01/2019 03/12/2019  Decreased Interest 0 0 0  Down, Depressed, Hopeless 0 0 0  PHQ - 2 Score 0 0 0    Past Medical  History:  Diagnosis Date  . Infected cuticle 03/08/2015   Left Hand 4th digit   . Obesity    Past Surgical History:  Procedure Laterality Date  . COLONOSCOPY WITH PROPOFOL N/A 04/09/2018   Procedure: COLONOSCOPY WITH PROPOFOL;  Surgeon: AnJonathon BellowsMD;  [facility];  Service: Gastroenterology;  Laterality: N/A;  . KNEE ARTHROSCOPY WITH MENISCAL REPAIR Right 03/19/2017   Procedure: RIGHT PARTIAL MEDIAL MENISCECTOMY;  Surgeon: PaLeim FabryMD;  [facility]: [facility];  Laterality: Right;  . left knee surgery     Social History   Socioeconomic History  . Marital status: Married    Spouse name: Not on file  . Number of children: Not on file  . Years of education: Not on file  . Highest education level: Not on file  Occupational History  . Not on file  Tobacco Use  . Smoking status: Never Smoker  . Smokeless tobacco: Never Used  Vaping Use  . Vaping Use: Never used  Substance and Sexual Activity  . Alcohol use: No  . Drug use: No  . Sexual activity: Not on file  Other Topics Concern  . Not on file  Social History Narrative  . Not on file   Social Determinants of Health   Financial Resource Strain:   . Difficulty of Paying Living Expenses: Not on file  Food Insecurity:   . Worried About RuCharity fundraisern the Last Year: Not on file  . Ran Out  of Food in the Last Year: Not on file  Transportation Needs:   . Lack of Transportation (Medical): Not on file  . Lack of Transportation (Non-Medical): Not on file  Physical Activity:   . Days of Exercise per Week: Not on file  . Minutes of Exercise per Session: Not on file  Stress:   . Feeling of Stress : Not on file  Social Connections:   . Frequency of Communication with Friends and Family: Not on file  . Frequency of Social Gatherings with Friends and Family: Not on file  . Attends Religious Services: Not on file  . Active Member of Clubs or Organizations: Not on file  .  Attends Archivist Meetings: Not on file  . Marital Status: Not on file  Intimate Partner Violence:   . Fear of Current or Ex-Partner: Not on file  . Emotionally Abused: Not on file  . Physically Abused: Not on file  . Sexually Abused: Not on file   Family History  Problem Relation Age of Onset  . Cancer Mother 16  . Multiple myeloma Mother   . Prostate cancer Neg Hx   . Bladder Cancer Neg Hx   . Kidney cancer Neg Hx   . Colon cancer Neg Hx    Current Outpatient Medications on File Prior to Visit  Medication Sig  . Multiple Vitamin (MULTIVITAMIN WITH MINERALS) TABS tablet Take 1 tablet by mouth daily.  . polyethylene glycol powder (GLYCOLAX/MIRALAX) powder Take 17 g by mouth daily as needed.   No current facility-administered medications on file prior to visit.    Review of Systems  Constitutional: Negative for activity change, appetite change, chills, diaphoresis, fatigue and fever.  HENT: Negative for congestion and hearing loss.   Eyes: Negative for visual disturbance.  Respiratory: Negative for cough, chest tightness, shortness of breath and wheezing.   Cardiovascular: Negative for chest pain, palpitations and leg swelling.  Gastrointestinal: Negative for abdominal pain, constipation, diarrhea, nausea and vomiting.  Genitourinary: Positive for difficulty urinating and urgency. Negative for decreased urine volume, dysuria, frequency and hematuria.       Nocturia   Musculoskeletal: Positive for arthralgias. Negative for back pain and neck pain.  Skin: Negative for rash.  Allergic/Immunologic: Negative for environmental allergies.  Neurological: Negative for dizziness, weakness, light-headedness, numbness and headaches.  Hematological: Negative for adenopathy.  Psychiatric/Behavioral: Negative for behavioral problems, dysphoric mood and sleep disturbance. The patient is not nervous/anxious.    Per HPI unless specifically indicated above     Objective:    BP  137/63   Pulse 87   Temp (!) 96.8 F (36 C) (Temporal)   Resp 16   Ht '5\' 8"'  (1.727 m)   Wt 282 lb 9.6 oz (128.2 kg)   SpO2 98%   BMI 42.97 kg/m   Wt Readings from Last 3 Encounters:  02/22/20 282 lb 9.6 oz (128.2 kg)  07/01/19 274 lb (124.3 kg)  03/12/19 269 lb (122 kg)    Physical Exam Vitals and nursing note reviewed.  Constitutional:      General: He is not in acute distress.    Appearance: He is well-developed. He is obese. He is not diaphoretic.     Comments: Well-appearing, comfortable, cooperative  HENT:     Head: Normocephalic and atraumatic.  Eyes:     General:        Right eye: No discharge.        Left eye: No discharge.     Conjunctiva/sclera: Conjunctivae  normal.     Pupils: Pupils are equal, round, and reactive to light.  Neck:     Thyroid: No thyromegaly.     Vascular: No carotid bruit.  Cardiovascular:     Rate and Rhythm: Normal rate and regular rhythm.     Heart sounds: Normal heart sounds. No murmur heard.   Pulmonary:     Effort: Pulmonary effort is normal. No respiratory distress.     Breath sounds: Normal breath sounds. No wheezing or rales.  Abdominal:     General: Bowel sounds are normal. There is no distension.     Palpations: Abdomen is soft. There is no mass.     Tenderness: There is no abdominal tenderness.  Genitourinary:    Comments: Declined DRE Musculoskeletal:        General: No tenderness. Normal range of motion.     Cervical back: Normal range of motion and neck supple.     Right lower leg: No edema.     Left lower leg: No edema.     Comments: Upper / Lower Extremities: - Normal muscle tone, strength bilateral upper extremities 5/5, lower extremities 5/5  Lymphadenopathy:     Cervical: No cervical adenopathy.  Skin:    General: Skin is warm and dry.     Findings: No erythema or rash.  Neurological:     Mental Status: He is alert and oriented to person, place, and time.     Comments: Distal sensation intact to light touch  all extremities  Psychiatric:        Behavior: Behavior normal.     Comments: Well groomed, good eye contact, normal speech and thoughts        Results for orders placed or performed in visit on 03/03/19  Comprehensive metabolic panel  Result Value Ref Range   Glucose 98 65 - 99 mg/dL   BUN 11 6 - 24 mg/dL   Creatinine, Ser 0.89 0.76 - 1.27 mg/dL   GFR calc non Af Amer 99 >59 mL/min/1.73   GFR calc Af Amer 114 >59 mL/min/1.73   BUN/Creatinine Ratio 12 9 - 20   Sodium 136 134 - 144 mmol/L   Potassium 4.9 3.5 - 5.2 mmol/L   Chloride 99 96 - 106 mmol/L   CO2 22 20 - 29 mmol/L   Calcium 9.8 8.7 - 10.2 mg/dL   Total Protein 7.8 6.0 - 8.5 g/dL   Albumin 4.3 3.8 - 4.9 g/dL   Globulin, Total 3.5 1.5 - 4.5 g/dL   Albumin/Globulin Ratio 1.2 1.2 - 2.2   Bilirubin Total 0.3 0.0 - 1.2 mg/dL   Alkaline Phosphatase 91 39 - 117 IU/L   AST 30 0 - 40 IU/L   ALT 32 0 - 44 IU/L  PSA  Result Value Ref Range   Prostate Specific Ag, Serum 1.8 0.0 - 4.0 ng/mL  Lipid panel  Result Value Ref Range   Cholesterol, Total 162 100 - 199 mg/dL   Triglycerides 131 0 - 149 mg/dL   HDL 34 (L) >39 mg/dL   VLDL Cholesterol Cal 24 5 - 40 mg/dL   LDL Chol Calc (NIH) 104 (H) 0 - 99 mg/dL   Chol/HDL Ratio 4.8 0.0 - 5.0 ratio  CBC with Differential/Platelet  Result Value Ref Range   WBC 5.7 3.4 - 10.8 x10E3/uL   RBC 5.30 4.14 - 5.80 x10E6/uL   Hemoglobin 14.9 13.0 - 17.7 g/dL   Hematocrit 44.2 37.5 - 51.0 %   MCV 83 79 - 97  fL   MCH 28.1 26.6 - 33.0 pg   MCHC 33.7 31 - 35 g/dL   RDW 14.9 11.6 - 15.4 %   Platelets 353 150 - 450 x10E3/uL   Neutrophils 35 Not Estab. %   Lymphs 53 Not Estab. %   Monocytes 9 Not Estab. %   Eos 2 Not Estab. %   Basos 1 Not Estab. %   Neutrophils Absolute 2.0 1.40 - 7.00 x10E3/uL   Lymphocytes Absolute 3.1 0 - 3 x10E3/uL   Monocytes Absolute 0.5 0 - 0 x10E3/uL   EOS (ABSOLUTE) 0.1 0.0 - 0.4 x10E3/uL   Basophils Absolute 0.0 0 - 0 x10E3/uL   Immature Granulocytes 0 Not  Estab. %   Immature Grans (Abs) 0.0 0.0 - 0.1 x10E3/uL  Hemoglobin A1c  Result Value Ref Range   Hgb A1c MFr Bld 5.6 4.8 - 5.6 %   Est. average glucose Bld gHb Est-mCnc 114 mg/dL      Assessment & Plan:   Problem List Items Addressed This Visit    Pre-diabetes    A1c previously improved to 5.6, due for repeat lab Concern with obesity, HTN, HLD  Plan:  1. Not on any therapy currently  2. Encourage improved lifestyle - low carb, low sugar diet, reduce portion size, continue improving regular exercise in future as tolerated by knee  F/u 6 month      Relevant Orders   Hemoglobin A1c   Morbid obesity with BMI of 40.0-44.9, adult ([place])    Still abnormal weight increase. Encourage lifestyle diet exercise      Relevant Orders   CBC with Differential/Platelet   Lipid panel   Hyperlipidemia    Due for repeat fasting lipid now, orders in Previously mild elevated LDL 110 > 104 Last lipid panel 2020 The 10-year ASCVD risk score Mikey Bussing DC Jr., et al., 2013) is: 11.5%  Plan: 1. Consider Statin therapy reduce ASCVD risk 2. Encourage improved lifestyle - low carb/cholesterol, reduce portion size, continue improving regular exercise as tolerated with knee  Will check lipid and follow-up based on results      Relevant Medications   valsartan-hydrochlorothiazide (DIOVAN-HCT) 160-25 MG tablet   amLODipine (NORVASC) 2.5 MG tablet   Other Relevant Orders   Lipid panel   Essential hypertension    Controlled HTN Home reading: reportedly normal No known complication  Plan: 1. Continue current meds: Valsartan -HCTZ 160-16m daily, Amlodipine 2.510mdaily - add refills 2. Encouraged improve lifestyle mods with diet and in future can improve regular exercise (as tolerated by knee) 3. Monitor BP outside office  Reconsider may STOP HCTZ and can increase Amlodipine if too much urinary frequency in future      Relevant Medications   valsartan-hydrochlorothiazide (DIOVAN-HCT) 160-25 MG  tablet   amLODipine (NORVASC) 2.5 MG tablet   Other Relevant Orders   CBC with Differential/Platelet   Lipid panel   Comprehensive metabolic panel   Benign prostatic hyperplasia with weak urinary stream    Gradual worsening symptoms Consistent clinically with persistent diagnosis BPH, with lower urinary tract symptoms (LUTS) - AUA BPH score 11-15 in past - Last PSA 1.8 (2020) - due today for lab - Last DRE 2 year ago reported mild enlarged - No known personal/family history of prostate CA  Plan: 1. Discussed that likely BPH still causing LUTS - medication alpha blocker may only improve the urinary stream may not resolve the urinary frequency - RESTART existing Tamsulosin 0.42m642maily, advised on benefits, risks, if BP low caution  with sudden standing up or position change - MAY DOUBLE DOSE for 0.52m x 2 = 0.845mif interested to try - Call back if need refills and which dose  Future advised would consider other treatment options such as switch alpha blocker vs finasteride vs Urology referral - because he may need PVR check and consider OAB treatment instead  Also MAY DC HCTZ in his combo pill for BP if this thiazide is causing too much urinary frequency      Relevant Medications   tamsulosin (FLOMAX) 0.4 MG CAPS capsule   Other Relevant Orders   PSA    Other Visit Diagnoses    Annual physical exam    -  Primary   Relevant Orders   Hemoglobin A1c   CBC with Differential/Platelet   Lipid panel   PSA   Comprehensive metabolic panel   Needs flu shot       Relevant Orders   Flu Vaccine QUAD 36+ mos IM (Completed)   Need for hepatitis C screening test       Relevant Orders   Hepatitis C antibody      Updated Health Maintenance information - Flu shot today - UTD Colonoscopy until 2029 - PSA due - will check with labs [facility] fasting orders in, this weke Encouraged improvement to lifestyle with diet and exercise - Goal of weight loss   Meds ordered this encounter    Medications  . valsartan-hydrochlorothiazide (DIOVAN-HCT) 160-25 MG tablet    Sig: Take 1 tablet by mouth daily.    Dispense:  90 tablet    Refill:  3    Add extra refills, not ready for fill yet.  . Marland KitchenmLODipine (NORVASC) 2.5 MG tablet    Sig: Take 1 tablet (2.5 mg total) by mouth daily.    Dispense:  90 tablet    Refill:  3    Add extra refills, not ready for fill yet.  . tamsulosin (FLOMAX) 0.4 MG CAPS capsule    Sig: Take 1 capsule (0.4 mg total) by mouth daily after breakfast. May consider take 2 capsule daily in future.    Dispense:  90 capsule    Refill:  1   Orders Placed This Encounter  Procedures  . Flu Vaccine QUAD 36+ mos IM  . Hemoglobin A1c  . CBC with Differential/Platelet  . Lipid panel    Order Specific Question:   Has the patient fasted?    Answer:   Yes  . PSA  . Comprehensive metabolic panel    Order Specific Question:   Has the patient fasted?    Answer:   Yes  . Hepatitis C antibody      Follow up plan: Return in about 6 months (around 08/22/2020) for 6 month follow-up PreDM A1c, HTN, Weight.  [facility] 02/22/2020, 4:17 PM

## 2020-02-22 NOTE — Assessment & Plan Note (Addendum)
Controlled HTN Home reading: reportedly normal No known complication  Plan: 1. Continue current meds: Valsartan -HCTZ 160-25mg  daily, Amlodipine 2.5mg  daily - add refills 2. Encouraged improve lifestyle mods with diet and in future can improve regular exercise (as tolerated by knee) 3. Monitor BP outside office  Reconsider may STOP HCTZ and can increase Amlodipine if too much urinary frequency in future

## 2020-02-29 DIAGNOSIS — I10 Essential (primary) hypertension: Secondary | ICD-10-CM | POA: Diagnosis not present

## 2020-02-29 DIAGNOSIS — R3912 Poor urinary stream: Secondary | ICD-10-CM | POA: Diagnosis not present

## 2020-02-29 DIAGNOSIS — E782 Mixed hyperlipidemia: Secondary | ICD-10-CM | POA: Diagnosis not present

## 2020-02-29 DIAGNOSIS — N401 Enlarged prostate with lower urinary tract symptoms: Secondary | ICD-10-CM | POA: Diagnosis not present

## 2020-02-29 DIAGNOSIS — R7303 Prediabetes: Secondary | ICD-10-CM | POA: Diagnosis not present

## 2020-02-29 DIAGNOSIS — Z1159 Encounter for screening for other viral diseases: Secondary | ICD-10-CM | POA: Diagnosis not present

## 2020-02-29 DIAGNOSIS — Z6841 Body Mass Index (BMI) 40.0 and over, adult: Secondary | ICD-10-CM | POA: Diagnosis not present

## 2020-02-29 DIAGNOSIS — Z0000 Encounter for general adult medical examination without abnormal findings: Secondary | ICD-10-CM | POA: Diagnosis not present

## 2020-02-29 LAB — LIPID PANEL

## 2020-02-29 LAB — COMPREHENSIVE METABOLIC PANEL

## 2020-03-01 LAB — COMPREHENSIVE METABOLIC PANEL
ALT: 41 IU/L (ref 0–44)
AST: 29 IU/L (ref 0–40)
Albumin/Globulin Ratio: 1.4 (ref 1.2–2.2)
Albumin: 4.4 g/dL (ref 3.8–4.9)
Alkaline Phosphatase: 85 IU/L (ref 44–121)
BUN/Creatinine Ratio: 9 (ref 9–20)
Bilirubin Total: 0.4 mg/dL (ref 0.0–1.2)
CO2: 23 mmol/L (ref 20–29)
Calcium: 9.6 mg/dL (ref 8.7–10.2)
Chloride: 98 mmol/L (ref 96–106)
Creatinine, Ser: 0.98 mg/dL (ref 0.76–1.27)
GFR calc Af Amer: 102 mL/min/{1.73_m2} (ref >59)
GFR calc non Af Amer: 88 mL/min/{1.73_m2} (ref >59)
Globulin, Total: 3.1 g/dL (ref 1.5–4.5)
Glucose: 103 mg/dL — ABNORMAL HIGH (ref 65–99)
Potassium: 4.3 mmol/L (ref 3.5–5.2)
Sodium: 136 mmol/L (ref 134–144)
Total Protein: 7.5 g/dL (ref 6.0–8.5)

## 2020-03-01 LAB — CBC WITH DIFFERENTIAL/PLATELET
Basophils Absolute: 0 10*3/uL (ref 0.0–0.2)
Basos: 1 %
EOS (ABSOLUTE): 0.1 10*3/uL (ref 0.0–0.4)
Eos: 2 %
Hematocrit: 43.2 % (ref 37.5–51.0)
Hemoglobin: 14.6 g/dL (ref 13.0–17.7)
Immature Grans (Abs): 0 10*3/uL (ref 0.0–0.1)
Immature Granulocytes: 0 %
Lymphocytes Absolute: 3.1 10*3/uL (ref 0.7–3.1)
Lymphs: 60 %
MCH: 28.1 pg (ref 26.6–33.0)
MCHC: 33.8 g/dL (ref 31.5–35.7)
MCV: 83 fL (ref 79–97)
Monocytes Absolute: 0.5 10*3/uL (ref 0.1–0.9)
Monocytes: 9 %
Neutrophils Absolute: 1.4 10*3/uL (ref 1.4–7.0)
Neutrophils: 28 %
Platelets: 305 10*3/uL (ref 150–450)
RBC: 5.19 x10E6/uL (ref 4.14–5.80)
RDW: 14.5 % (ref 11.6–15.4)
WBC: 5.1 10*3/uL (ref 3.4–10.8)

## 2020-03-01 LAB — PSA: Prostate Specific Ag, Serum: 2.2 ng/mL (ref 0.0–4.0)

## 2020-03-01 LAB — HEPATITIS C ANTIBODY: Hep C Virus Ab: <0.1 s/co ratio (ref 0.0–0.9)

## 2020-03-01 LAB — LIPID PANEL
Chol/HDL Ratio: 5 ratio (ref 0.0–5.0)
Cholesterol, Total: 184 mg/dL (ref 100–199)
HDL: 37 mg/dL — ABNORMAL LOW (ref >39)
Triglycerides: 127 mg/dL (ref 0–149)
VLDL Cholesterol Cal: 23 mg/dL (ref 5–40)

## 2020-03-01 LAB — HEMOGLOBIN A1C
Est. average glucose Bld gHb Est-mCnc: 123 mg/dL
Hgb A1c MFr Bld: 5.9 % — ABNORMAL HIGH (ref 4.8–5.6)

## 2020-08-22 ENCOUNTER — Other Ambulatory Visit: Payer: Self-pay | Admitting: Family Medicine

## 2020-08-22 DIAGNOSIS — R3912 Poor urinary stream: Secondary | ICD-10-CM

## 2020-08-22 DIAGNOSIS — N401 Enlarged prostate with lower urinary tract symptoms: Secondary | ICD-10-CM

## 2020-09-21 ENCOUNTER — Ambulatory Visit: Payer: Self-pay | Admitting: Family Medicine

## 2020-09-26 ENCOUNTER — Other Ambulatory Visit: Payer: Self-pay

## 2020-09-26 ENCOUNTER — Ambulatory Visit: Payer: BC Managed Care – PPO | Admitting: Family Medicine

## 2020-09-26 ENCOUNTER — Encounter: Payer: Self-pay | Admitting: Family Medicine

## 2020-09-26 VITALS — BP 106/70 | HR 83 | Resp 16 | Ht 68.0 in | Wt 280.4 lb

## 2020-09-26 DIAGNOSIS — M722 Plantar fascial fibromatosis: Secondary | ICD-10-CM | POA: Diagnosis not present

## 2020-09-26 DIAGNOSIS — M2142 Flat foot [pes planus] (acquired), left foot: Secondary | ICD-10-CM | POA: Diagnosis not present

## 2020-09-26 DIAGNOSIS — M2141 Flat foot [pes planus] (acquired), right foot: Secondary | ICD-10-CM

## 2020-09-26 NOTE — Progress Notes (Signed)
Subjective:    Patient ID: Richard Wheeler, male    DOB: [birth year], 55 y.o.   MRN: [id number]  Richard Wheeler is a 55 y.o. male presenting on 09/26/2020 for Feet Pain (Bilateral feet pain for 6 weeks)   HPI   Bilateral Foot Pain R>L Suspected Plantar Fasciitis Reports 6 weeks ago onset with gradual worsening bilateral R worse than L foot pain, mostly bottom of both feet can be mostly arch and back / heel of foot, the front of foot does not hurt much. - he works mostly walking every day at [employer], he wears steel toed shoes at work. He took 1 week off and had same pain but it was less severe with less walking Taking occasional ibuprofen PRN some relief Not using insoles or arch supports Denies swelling redness rash ulceration, numbness tingling   Depression screen [facility] 2/9 09/26/2020 02/22/2020 07/01/2019  Decreased Interest 1 0 0  Down, Depressed, Hopeless 0 0 0  PHQ - 2 Score 1 0 0  Altered sleeping 1 - -  Tired, decreased energy 0 - -  Change in appetite 0 - -  Feeling bad or failure about yourself  0 - -  Trouble concentrating 0 - -  Moving slowly or fidgety/restless 0 - -  Suicidal thoughts 0 - -  PHQ-9 Score 2 - -  Difficult doing work/chores Not difficult at all - -    Social History   Tobacco Use  . Smoking status: Never Smoker  . Smokeless tobacco: Never Used  Vaping Use  . Vaping Use: Never used  Substance Use Topics  . Alcohol use: No  . Drug use: No    Review of Systems Per HPI unless specifically indicated above     Objective:    BP 106/70 (BP Location: Left Arm, Patient Position: Sitting, Cuff Size: Large)   Pulse 83   Resp 16   Ht 5\' 8"  (1.727 m)   Wt 280 lb 6.4 oz (127.2 kg)   SpO2 99%   BMI 42.63 kg/m   Wt Readings from Last 3 Encounters:  09/26/20 280 lb 6.4 oz (127.2 kg)  02/22/20 282 lb 9.6 oz (128.2 kg)  07/01/19 274 lb (124.3 kg)    Physical Exam Vitals and nursing note reviewed.  Constitutional:      General: He is not  in acute distress.    Appearance: He is well-developed. He is not diaphoretic.     Comments: Well-appearing, comfortable, cooperative  HENT:     Head: Normocephalic and atraumatic.  Eyes:     General:        Right eye: No discharge.        Left eye: No discharge.     Conjunctiva/sclera: Conjunctivae normal.  Cardiovascular:     Rate and Rhythm: Normal rate.  Pulmonary:     Effort: Pulmonary effort is normal.  Musculoskeletal:     Comments: Bilateral Feet Standing demonstrates loss of medial arch bilaterally with pes planus. He has some thicker callus heel both feet, without ulceration. Has no provoked tenderness on my exam at particular points of arch and heel. Forefoot and great toes intact.  Skin:    General: Skin is warm and dry.     Findings: No erythema or rash.  Neurological:     Mental Status: He is alert and oriented to person, place, and time.  Psychiatric:        Behavior: Behavior normal.     Comments: Well groomed, good eye contact,  normal speech and thoughts       Results for orders placed or performed in visit on 02/22/20  Hemoglobin A1c  Result Value Ref Range   Hgb A1c MFr Bld 5.9 (H) 4.8 - 5.6 %   Est. average glucose Bld gHb Est-mCnc 123 mg/dL  CBC with Differential/Platelet  Result Value Ref Range   WBC 5.1 3.4 - 10.8 x10E3/uL   RBC 5.19 4.14 - 5.80 x10E6/uL   Hemoglobin 14.6 13.0 - 17.7 g/dL   Hematocrit 94.3 27.6 - 51.0 %   MCV 83 79 - 97 fL   MCH 28.1 26.6 - 33.0 pg   MCHC 33.8 31.5 - 35.7 g/dL   RDW 14.7 09.2 - 95.7 %   Platelets 305 150 - 450 x10E3/uL   Neutrophils 28 Not Estab. %   Lymphs 60 Not Estab. %   Monocytes 9 Not Estab. %   Eos 2 Not Estab. %   Basos 1 Not Estab. %   Neutrophils Absolute 1.4 1.4 - 7.0 x10E3/uL   Lymphocytes Absolute 3.1 0.7 - 3.1 x10E3/uL   Monocytes Absolute 0.5 0.1 - 0.9 x10E3/uL   EOS (ABSOLUTE) 0.1 0.0 - 0.4 x10E3/uL   Basophils Absolute 0.0 0.0 - 0.2 x10E3/uL   Immature Granulocytes 0 Not Estab. %    Immature Grans (Abs) 0.0 0.0 - 0.1 x10E3/uL  Lipid panel  Result Value Ref Range   Cholesterol, Total 184 100 - 199 mg/dL   Triglycerides 473 0 - 149 mg/dL   HDL 37 (L) >40 mg/dL   VLDL Cholesterol Cal 23 5 - 40 mg/dL   LDL Chol Calc (NIH) 370 (H) 0 - 99 mg/dL   Chol/HDL Ratio 5.0 0.0 - 5.0 ratio  PSA  Result Value Ref Range   Prostate Specific Ag, Serum 2.2 0.0 - 4.0 ng/mL  Comprehensive metabolic panel  Result Value Ref Range   Glucose 103 (H) 65 - 99 mg/dL   BUN 9 6 - 24 mg/dL   Creatinine, Ser 9.64 0.76 - 1.27 mg/dL   GFR calc non Af Amer 88 >59 mL/min/1.73   GFR calc Af Amer 102 >59 mL/min/1.73   BUN/Creatinine Ratio 9 9 - 20   Sodium 136 134 - 144 mmol/L   Potassium 4.3 3.5 - 5.2 mmol/L   Chloride 98 96 - 106 mmol/L   CO2 23 20 - 29 mmol/L   Calcium 9.6 8.7 - 10.2 mg/dL   Total Protein 7.5 6.0 - 8.5 g/dL   Albumin 4.4 3.8 - 4.9 g/dL   Globulin, Total 3.1 1.5 - 4.5 g/dL   Albumin/Globulin Ratio 1.4 1.2 - 2.2   Bilirubin Total 0.4 0.0 - 1.2 mg/dL   Alkaline Phosphatase 85 44 - 121 IU/L   AST 29 0 - 40 IU/L   ALT 41 0 - 44 IU/L  Hepatitis C antibody  Result Value Ref Range   Hep C Virus Ab <0.1 0.0 - 0.9 s/co ratio      Assessment & Plan:   Problem List Items Addressed This Visit   None   Visit Diagnoses    Plantar fasciitis, bilateral    -  Primary   Relevant Orders   Ambulatory referral to Podiatry   Pes planus of both feet       Relevant Orders   Ambulatory referral to Podiatry      Clinically consistent with subacute on chronic bilateral R>L plantar fasciitis based on history and exam, localized pain. Likely underlying etiology with prolonged standing / repetitive work -  No known injury. Unlikely fracture based on history, no bony tenderness. - Limited conservative therapy   Plan: 1. Reviewed diagnosis and management of plantar fasciitis 2. Start OTC NSAID trial - Ibuprofen vs- Naproxen 500mg  BID wc x 1-2 weeks PRN 3. May take Tylenol PRN  breakthrough 4. May use topical muscle rub, ice 5. Emphasized importance of relative rest, ice, and avoid prolonged standing and overuse 6. Handout given and explained appropriate stretching and home exercises important to do first thing in morning and later  Referral to Podiatry for further management. [facility]  No orders of the defined types were placed in this encounter.  Orders Placed This Encounter  Procedures  . Ambulatory referral to Podiatry    Referral Priority:   Routine    Referral Type:   Consultation    Referral Reason:   Specialty Services Required    Requested Specialty:   Podiatry    Number of Visits Requested:   1      Follow up plan: Return if symptoms worsen or fail to improve.   [facility], DO [facility] 09/26/2020, 3:56 PM

## 2020-09-26 NOTE — Patient Instructions (Addendum)
Thank you for coming to the office today.  [facility] [address] [place], [place] [address] Hours - M-F 8-5 Phone: [phone] phone  You most likely have Plantar Fasciitis of heel / foot. - This is inflammation of the fibrous connection on the bottom of the foot, and can have small micro tears over time that become painful. It usually will have flare ups lasting days to weeks, and may come back after it heals if it is re-aggravated again. - Often there is a bone spur or arthritis of the heel bone that causes this - Also it may be caused by abnormal footwear, walking pattern or other problems  If you are experiencing an acute flare with pain, this is usually worst first thing in the morning when the plantar fascia is tight and stiff. First step out of bed is painful usually, and it may gradually improve with stretching and walking.  Recommend trial of Anti-inflammatory with Naproxen (Aleve OTC) 500mg  tabs - take one with food and plenty of water TWICE daily every day (breakfast and dinner), for next 1 to 2 weeks, then you may take only as needed - DO NOT TAKE any ibuprofen, aleve, motrin while you are taking this medicine - It is safe to take Tylenol Ext Str 500mg  tabs - take 1 to 2 (max dose 1000mg ) every 6 hours as needed for breakthrough pain, max 24 hour daily dose is 6 to 8 tablets or 4000mg   Can try topical Voltaren 1-4 times daily as needed instead of pills for anti inflammatory  Recommend: - Rest / relative rest with activity modification avoid overuse / prolonged stand - Ice packs (make sure you use a towel or sock / something to protect skin)  May also try topical muscle rub, icy hot, tiger balm  Start the exercises listed below, gradually increase them as instructed. May be sore at first and hopefully will stretch out and help reduce pain later.  If not improving after 1-2 weeks on medicine, and stretching exercises and some rest - then can contact our  office again for re-evaluation may consider other causes or can refer you to Orthopedic specialist to have second opinion, and consider a steroid injection.    Please schedule a Follow-up Appointment to: Return if symptoms worsen or fail to improve.  If you have any other questions or concerns, please feel free to call the office or send a message through MyChart. You may also schedule an earlier appointment if necessary.  Additionally, you may be receiving a survey about your experience at our office within a few days to 1 week by e-mail or mail. We value your feedback.  , DO [facility], [facility]              Plantar Fascia Stretches / Exercises  See other page with pictures of each exercise.  Start with 1 or 2 of these exercises that you are most comfortable with. Do not do any exercises that cause you significant worsening pain. Some of these may cause some "stretching soreness" but it should go away after you stop the exercise, and get better over time. Gradually increase up to 3-4 exercises as tolerated.  You may begin exercising the muscles of your foot right away by gently stretching them as follows:  Stretching: Towel stretch: Sit on a hard surface with your injured leg stretched out in front of you. Loop a towel around the ball of your foot and pull the  towel toward your body keeping your knee straight. Hold this position for 15 to 30 seconds then relax. Repeat 3 times. When the towel stretch becomes to easy, you may begin doing the standing calf stretch.  Standing calf stretch: Facing a wall, put your hands against the wall at about eye level. Keep the injured leg back, the uninjured leg forward, and the heel of your injured leg on the floor. Turn your injured foot slightly inward (as if you were pigeon-toed) as you slowly lean into the wall until you feel a stretch in the back of your calf. Hold for 15 to 30 seconds. Repeat 3 times. Do  this exercise several times each day. When you can stand comfortably on your injured foot, you can begin stretching the bottom of your foot using the plantar fascia stretch.  Plantar fascia stretch: Stand with the ball of your injured foot on a stair. Reach for the bottom step with your heel until you feel a stretch in the arch of your foot. Hold this position for 15 to 30 seconds and then relax. Repeat 3 times. After you have stretched the bottom muscles of your foot, you can begin strengthening the top muscles of your foot.  Frozen can roll: Roll your bare injured foot back and forth from your heel to your mid-arch over a frozen juice can. Repeat for 3 to 5 minutes. This exercise is particularly helpful if done first thing in the morning. Towel pickup: With your heel on the ground, pick up a towel with your toes. Release. Repeat 10 to 20 times. When this gets easy, add more resistance by placing a book or small weight on the towel. Static and dynamic balance exercises Place a chair next to your non-injured leg and stand upright. (This will provide you with balance if needed.) Stand on your injured foot. Try to raise the arch of your foot while keeping your toes on the floor. Try to maintain this position and balance on your injured side for 30 seconds. This exercise can be made more difficult by doing it on a piece of foam or a pillow, or with your eyes closed. Stand in the same position as above. Keep your foot in this position and reach forward in front of you with your injured side's hand, allowing your knee to bend. Repeat this 10 times while maintaining the arch height. This exercise can be made more difficult by reaching farther in front of you. Do 2 sets. Stand in the same position as above. While maintaining your arch height, reach the injured side's hand across your body toward the chair. The farther you reach, the more challenging the exercise. Do 2 sets of 10.  Next, you can begin  strengthening the muscles of your foot and lower leg by using elastic tubing.  Strengthening: Resisted dorsiflexion: Sit with your injured leg out straight and your foot facing a doorway. Tie a loop in one end of the tubing. Put your foot through the loop so that the tubing goes around the arch of your foot. Tie a knot in the other end of the tubing and shut the knot in the door. Move backward until there is tension in the tubing. Keeping your knee straight, pull your foot toward your body, stretching the tubing. Slowly return to the starting position. Do 3 sets of 10. Resisted plantar flexion: Sit with your leg outstretched and loop the middle section of the tubing around the ball of your foot. Hold the ends of  the tubing in both hands. Gently press the ball of your foot down and point your toes, stretching the tubing. Return to the starting position. Do 3 sets of 10. Resisted inversion: Sit with your legs out straight and cross your uninjured leg over your injured ankle. Wrap the tubing around the ball of your injured foot and then loop it around your uninjured foot so that the tubing is anchored there at one end. Hold the other end of the tubing in your hand. Turn your injured foot inward and upward. This will stretch the tubing. Return to the starting position. Do 3 sets of 10. Resisted eversion: Sit with both legs stretched out in front of you, with your feet about a shoulder's width apart. Tie a loop in one end of the tubing. Put your injured foot through the loop so that the tubing goes around the arch of that foot and wraps around the outside of the uninjured foot. Hold onto the other end of the tubing with your hand to provide tension. Turn your injured foot up and out. Make sure you keep your uninjured foot still so that it will allow the tubing to stretch as you move your injured foot. Return to the starting position. Do 3 sets of 10.

## 2020-10-12 DIAGNOSIS — M79671 Pain in right foot: Secondary | ICD-10-CM | POA: Diagnosis not present

## 2020-10-12 DIAGNOSIS — M7731 Calcaneal spur, right foot: Secondary | ICD-10-CM | POA: Diagnosis not present

## 2020-10-12 DIAGNOSIS — M722 Plantar fascial fibromatosis: Secondary | ICD-10-CM | POA: Diagnosis not present

## 2020-10-12 DIAGNOSIS — M79672 Pain in left foot: Secondary | ICD-10-CM | POA: Diagnosis not present

## 2020-10-12 DIAGNOSIS — M7732 Calcaneal spur, left foot: Secondary | ICD-10-CM | POA: Diagnosis not present

## 2021-03-04 ENCOUNTER — Other Ambulatory Visit: Payer: Self-pay | Admitting: Family Medicine

## 2021-03-04 DIAGNOSIS — I10 Essential (primary) hypertension: Secondary | ICD-10-CM

## 2021-03-04 NOTE — Telephone Encounter (Signed)
Called pt and LM on VM to call back to schedule OV for med refills. Call back number provided.  Last RF 02/22/20 #90 3 RF RF due and med is active. Requested Prescriptions  Pending Prescriptions Disp Refills   amLODipine (NORVASC) 2.5 MG tablet [Pharmacy Med Name: AMLODIPINE BESYLATE 2.5 MG TAB] 90 tablet 3    Sig: TAKE 1 TABLET BY MOUTH EVERY DAY     Cardiovascular:  Calcium Channel Blockers Passed - 03/04/2021 12:42 AM      Passed - Last BP in normal range    BP Readings from Last 1 Encounters:  09/26/20 106/70          Passed - Valid encounter within last 6 months    Recent Outpatient Visits           5 months ago Plantar fasciitis, bilateral   [facility] Smitty Cords, DO   1 year ago Annual physical exam   [facility] Smitty Cords, DO   1 year ago Primary osteoarthritis of right knee   [facility] Smitty Cords, DO   1 year ago Annual physical exam   [facility] Smitty Cords, DO   2 years ago Essential hypertension   [facility] [place], Netta Neat, DO

## 2021-03-04 NOTE — Telephone Encounter (Signed)
Pt needs appt for additional refills. Med not addressed at last appt.

## 2021-03-16 ENCOUNTER — Other Ambulatory Visit: Payer: Self-pay | Admitting: Family Medicine

## 2021-03-16 DIAGNOSIS — I10 Essential (primary) hypertension: Secondary | ICD-10-CM

## 2021-03-16 NOTE — Telephone Encounter (Signed)
Requested Prescriptions  Pending Prescriptions Disp Refills  . valsartan-hydrochlorothiazide (DIOVAN-HCT) 160-25 MG tablet [Pharmacy Med Name: VALSARTAN-HCTZ 160-25 MG TAB] 90 tablet 0    Sig: TAKE 1 TABLET BY MOUTH EVERY DAY     Cardiovascular: ARB + Diuretic Combos Failed - 03/16/2021  4:03 AM      Failed - K in normal range and within 180 days    Potassium  Date Value Ref Range Status  02/29/2020 4.3 3.5 - 5.2 mmol/L Final         Failed - Na in normal range and within 180 days    Sodium  Date Value Ref Range Status  02/29/2020 136 134 - 144 mmol/L Final         Failed - Cr in normal range and within 180 days    Creatinine, Ser  Date Value Ref Range Status  02/29/2020 0.98 0.76 - 1.27 mg/dL Final         Failed - Ca in normal range and within 180 days    Calcium  Date Value Ref Range Status  02/29/2020 9.6 8.7 - 10.2 mg/dL Final         Passed - Patient is not pregnant      Passed - Last BP in normal range    BP Readings from Last 1 Encounters:  09/26/20 106/70         Passed - Valid encounter within last 6 months    Recent Outpatient Visits          5 months ago Plantar fasciitis, bilateral   [facility] Smitty Cords, DO   1 year ago Annual physical exam   [facility] Smitty Cords, DO   1 year ago Primary osteoarthritis of right knee   [facility] [place], Netta Neat, DO   2 years ago Annual physical exam   [facility] Smitty Cords, DO   2 years ago Essential hypertension   [facility] [place], Netta Neat, DO      Future Appointments            In 1 month Althea Charon, Netta Neat, DO [facility], [facility]

## 2021-03-30 ENCOUNTER — Other Ambulatory Visit: Payer: Self-pay | Admitting: Family Medicine

## 2021-03-30 DIAGNOSIS — N401 Enlarged prostate with lower urinary tract symptoms: Secondary | ICD-10-CM

## 2021-03-31 DIAGNOSIS — S52502A Unspecified fracture of the lower end of left radius, initial encounter for closed fracture: Secondary | ICD-10-CM | POA: Diagnosis not present

## 2021-03-31 DIAGNOSIS — S6992XA Unspecified injury of left wrist, hand and finger(s), initial encounter: Secondary | ICD-10-CM | POA: Diagnosis not present

## 2021-03-31 NOTE — Telephone Encounter (Signed)
Requested Prescriptions  Pending Prescriptions Disp Refills  . tamsulosin (FLOMAX) 0.4 MG CAPS capsule [Pharmacy Med Name: TAMSULOSIN HCL 0.4 MG CAPSULE] 180 capsule 0    Sig: TAKE 1 CAPSULE (0.4 MG TOTAL) BY MOUTH DAILY AFTER BREAKFAST. MAY CONSIDER TAKE 2 CAPSULE DAILY IN FUTURE.     Urology: Alpha-Adrenergic Blocker Passed - 03/30/2021 12:28 PM      Passed - Last BP in normal range    BP Readings from Last 1 Encounters:  09/26/20 106/70         Passed - Valid encounter within last 12 months    Recent Outpatient Visits          6 months ago Plantar fasciitis, bilateral   [facility] Althea Charon, Netta Neat, DO   1 year ago Annual physical exam   [facility] Smitty Cords, DO   1 year ago Primary osteoarthritis of right knee   [facility] Smitty Cords, DO   2 years ago Annual physical exam   [facility] Smitty Cords, DO   2 years ago Essential hypertension   [facility] Althea Charon, Netta Neat, DO      Future Appointments            In 1 month Althea Charon, Netta Neat, DO [facility], [facility]

## 2021-04-11 DIAGNOSIS — M25532 Pain in left wrist: Secondary | ICD-10-CM | POA: Diagnosis not present

## 2021-04-11 DIAGNOSIS — M778 Other enthesopathies, not elsewhere classified: Secondary | ICD-10-CM | POA: Diagnosis not present

## 2021-04-11 DIAGNOSIS — M19132 Post-traumatic osteoarthritis, left wrist: Secondary | ICD-10-CM | POA: Diagnosis not present

## 2021-05-05 ENCOUNTER — Ambulatory Visit (INDEPENDENT_AMBULATORY_CARE_PROVIDER_SITE_OTHER): Payer: BC Managed Care – PPO | Admitting: Family Medicine

## 2021-05-05 ENCOUNTER — Encounter: Payer: Self-pay | Admitting: Family Medicine

## 2021-05-05 ENCOUNTER — Other Ambulatory Visit: Payer: Self-pay

## 2021-05-05 VITALS — BP 132/82 | HR 92 | Temp 98.5°F | Ht 68.0 in | Wt 281.0 lb

## 2021-05-05 DIAGNOSIS — N401 Enlarged prostate with lower urinary tract symptoms: Secondary | ICD-10-CM

## 2021-05-05 DIAGNOSIS — R3912 Poor urinary stream: Secondary | ICD-10-CM

## 2021-05-05 DIAGNOSIS — Z0000 Encounter for general adult medical examination without abnormal findings: Secondary | ICD-10-CM | POA: Diagnosis not present

## 2021-05-05 DIAGNOSIS — R7303 Prediabetes: Secondary | ICD-10-CM

## 2021-05-05 DIAGNOSIS — M25542 Pain in joints of left hand: Secondary | ICD-10-CM

## 2021-05-05 DIAGNOSIS — Z6841 Body Mass Index (BMI) 40.0 and over, adult: Secondary | ICD-10-CM

## 2021-05-05 DIAGNOSIS — Z23 Encounter for immunization: Secondary | ICD-10-CM

## 2021-05-05 DIAGNOSIS — E782 Mixed hyperlipidemia: Secondary | ICD-10-CM

## 2021-05-05 DIAGNOSIS — I10 Essential (primary) hypertension: Secondary | ICD-10-CM

## 2021-05-05 DIAGNOSIS — Z125 Encounter for screening for malignant neoplasm of prostate: Secondary | ICD-10-CM

## 2021-05-05 DIAGNOSIS — M25541 Pain in joints of right hand: Secondary | ICD-10-CM

## 2021-05-05 MED ORDER — VALSARTAN-HYDROCHLOROTHIAZIDE 160-25 MG PO TABS: 1.0000 | ORAL_TABLET | Freq: Every day | ORAL | 3 refills | Status: DC

## 2021-05-05 MED ORDER — TAMSULOSIN HCL 0.4 MG PO CAPS
0.4000 mg | ORAL_CAPSULE | Freq: Every day | ORAL | 3 refills | Status: DC
Start: 2021-05-05 — End: 2022-04-03

## 2021-05-05 NOTE — Patient Instructions (Addendum)
Thank you for coming to the office today.  Flu Shot today  Please schedule and return for a NURSE ONLY VISIT for VACCINE - Need Shingles vaccine (Shingrix) 2 doses 2-6 months apart, strong vaccine flu like symptoms more common after 2nd dose, prevents future long term nerve damage from shingles.   START anti inflammatory topical - OTC Voltaren (generic Diclofenac) topical 2-4 times a day as needed for pain swelling of affected joint for 1-2 weeks or longer.   Recommend trial of Anti-inflammatory with Ibuprofen 200mg  x 2-3 pills per dose every 8 hours or 3 times a day with meal. For about 2-4 weeks at a time then stop for a few weeks can restart - It is safe to take Tylenol Ext Str 500mg  tabs - take 1 to 2 (max dose 1000mg ) every 6 hours as needed for breakthrough pain, max 24 hour daily dose is 6 to 8 tablets or 4000mg   If nothing is working I can refer you to the Sports Medicine specialist Dr in [place]  Please schedule a Follow-up Appointment to: Return in about 1 year (around 05/05/2022) for 1 year Annual Physical ([facility] orders after).  If you have any other questions or concerns, please feel free to call the office or send a message through MyChart. You may also schedule an earlier appointment if necessary.  Additionally, you may be receiving a survey about your experience at our office within a few days to 1 week by e-mail or mail. We value your feedback.  , DO [facility], [place]

## 2021-05-05 NOTE — Assessment & Plan Note (Signed)
Due for repeat fasting lipid now, orders in Previously mild elevated LDL 110 > 104 Last lipid panel 2020 - due for repeat The 10-year ASCVD risk score (Arnett DK, et al., 2019) is: 11.3%  Plan: 1. Consider Statin therapy reduce ASCVD risk 2. Encourage improved lifestyle - low carb/cholesterol, reduce portion size, continue improving regular exercise as tolerated with knee  Will check lipid and follow-up based on results

## 2021-05-05 NOTE — Assessment & Plan Note (Signed)
Due for lab, prior A1c 5.9 Encourage lifestyle modification

## 2021-05-05 NOTE — Assessment & Plan Note (Signed)
Controlled HTN Home reading: reportedly normal No known complication  Plan: 1. Continue current meds: Valsartan -HCTZ 160-25mg  daily, Amlodipine 2.5mg  daily - add refills 2. Encouraged improve lifestyle mods with diet and in future can improve regular exercise (as tolerated by knee) 3. Monitor BP outside office

## 2021-05-05 NOTE — Assessment & Plan Note (Signed)
Improved on alpha blocker Consistent clinically with persistent diagnosis BPH, with lower urinary tract symptoms (LUTS) - AUA BPH score 11-15 in past - Last PSA 1.8 (2020) - due today for lab - Last DRE 2 year ago reported mild enlarged - No known personal/family history of prostate CA  Plan: 1. Continue Tamsulosin 0.4mg  daily  Future advised would consider other treatment options such as switch alpha blocker vs finasteride vs Urology referral - because he may need PVR check and consider OAB treatment instead  Also MAY DC HCTZ in his combo pill for BP if this thiazide is causing too much urinary frequency

## 2021-05-05 NOTE — Progress Notes (Signed)
Subjective:    Patient ID: Richard Wheeler, male    DOB: 14-May-1965, 55 y.o.   MRN: [id number]  Richard Wheeler is a 55 y.o. male presenting on 05/05/2021 for Annual Exam   HPI   Here for Annual Physical and lab Review.   CHRONIC HTN / Morbid Obesity BMI >42 - Interval update with BP at home readings in normal range. - Today patient reports no new concerns Current meds: Valsartan-HCTZ 160-21m daily, Amlodipine 2.523mReports good compliance, took meds today. Tolerating well, w/o complaints. Lifestyle Weight stable - Diet: not always following appropriate diet - goal to improve - Exercise: goal to improve exercise regimen, now back on track Denies CP, dyspnea, HA, edema, dizziness / lightheadedness    HYPERLIPIDEMIA: - Reports no concerns Due for repeat lipids now Not on medicine statin   BPH with LUTS He was on OTC prostate supplement PRN with mixed results - Has not seen Urologist Taking Tamsulosin 0.10m510maily with good results Due for PSA lab  Admits L wrist pain working with ortho has had x-rays thought to be OA/DJD tendonitis He has some persistent distal DIP and knuckle joint pain at times. Repetitive work with hands. No new injury Onset 3 months, gradual improved Asking about RA testing, fam history of RA    Health Maintenance:   Due for Flu Shot, will receive today    UTD COVID19 vaccine Moderna vaccine, last dose 08/17/19 - next booster due anytime.   Colon CA Screening: Last Colonoscopy 04/09/18 (done by Dr AnnVicente MalesI), results with no polyp, good for 10 years - next due 2029. Currently asymptomatic. No known family history of colon CA.   Prostate CA Screening: Last DRE mild enlarged 2018. Last PSA 1.8 (03/2019, prior results 1.1 to 1.0 years 2018 and 2019). Currently some worsening BPH LUTS. No known family history of prostate CA.  - Due for screening lab PSA will order now.    [facility] 05/05/2021 09/26/2020 02/22/2020  Decreased Interest  0 1 0  Down, Depressed, Hopeless 0 0 0  PHQ - 2 Score 0 1 0  Altered sleeping 1 1 -  Tired, decreased energy 0 0 -  Change in appetite 0 0 -  Feeling bad or failure about yourself  0 0 -  Trouble concentrating 0 0 -  Moving slowly or fidgety/restless 0 0 -  Suicidal thoughts 0 0 -  PHQ-9 Score 1 2 -  Difficult doing work/chores Not difficult at all Not difficult at all -    Past Medical History:  Diagnosis Date   Infected cuticle 03/08/2015   Left Hand 4th digit    Obesity    Past Surgical History:  Procedure Laterality Date   COLONOSCOPY WITH PROPOFOL N/A 04/09/2018   Procedure: COLONOSCOPY WITH PROPOFOL;  Surgeon: AnnJonathon BellowsD;  [facility]  Service: Gastroenterology;  Laterality: N/A;   KNEE ARTHROSCOPY WITH MENISCAL REPAIR Right 03/19/2017   Procedure: RIGHT PARTIAL MEDIAL MENISCECTOMY;  Surgeon: PatLeim FabryD;  [facility];  Laterality: Right;   left knee surgery     Social History   Socioeconomic History   Marital status: Married    Spouse name: Not on file   Number of children: Not on file   Years of education: Not on file   Highest education level: Not on file  Occupational History   Not on file  Tobacco Use   Smoking status: Never   Smokeless tobacco: Never  Vaping  Use   Vaping Use: Never used  Substance and Sexual Activity   Alcohol use: No   Drug use: No   Sexual activity: Not on file  Other Topics Concern   Not on file  Social History Narrative   Not on file   Social Determinants of Health   Financial Resource Strain: Not on file  Food Insecurity: Not on file  Transportation Needs: Not on file  Physical Activity: Not on file  Stress: Not on file  Social Connections: Not on file  Intimate Partner Violence: Not on file   Family History  Problem Relation Age of Onset   Cancer Mother 74   Multiple myeloma Mother    Prostate cancer Neg Hx    Bladder Cancer Neg Hx    Kidney cancer Neg  Hx    Colon cancer Neg Hx    Current Outpatient Medications on File Prior to Visit  Medication Sig   amLODipine (NORVASC) 2.5 MG tablet TAKE 1 TABLET BY MOUTH EVERY DAY   Multiple Vitamin (MULTIVITAMIN WITH MINERALS) TABS tablet Take 1 tablet by mouth daily.   polyethylene glycol powder (GLYCOLAX/MIRALAX) powder Take 17 g by mouth daily as needed.   No current facility-administered medications on file prior to visit.    Review of Systems  Constitutional:  Negative for activity change, appetite change, chills, diaphoresis, fatigue and fever.  HENT:  Negative for congestion and hearing loss.   Eyes:  Negative for visual disturbance.  Respiratory:  Negative for cough, chest tightness, shortness of breath and wheezing.   Cardiovascular:  Negative for chest pain, palpitations and leg swelling.  Gastrointestinal:  Negative for abdominal pain, constipation, diarrhea, nausea and vomiting.  Genitourinary:  Negative for dysuria, frequency and hematuria.  Musculoskeletal:  Positive for arthralgias. Negative for neck pain.  Skin:  Negative for rash.  Neurological:  Negative for dizziness, weakness, light-headedness, numbness and headaches.  Hematological:  Negative for adenopathy.  Psychiatric/Behavioral:  Negative for behavioral problems, dysphoric mood and sleep disturbance.   Per HPI unless specifically indicated above      Objective:    BP 132/82 (BP Location: Left Arm, Cuff Size: Normal)    Pulse 92    Temp 98.5 F (36.9 C) (Oral)    Ht '5\' 8"'  (1.727 m)    Wt 281 lb (127.5 kg)    SpO2 97%    BMI 42.73 kg/m   Wt Readings from Last 3 Encounters:  05/05/21 281 lb (127.5 kg)  09/26/20 280 lb 6.4 oz (127.2 kg)  02/22/20 282 lb 9.6 oz (128.2 kg)    Physical Exam Vitals and nursing note reviewed.  Constitutional:      General: He is not in acute distress.    Appearance: He is well-developed. He is obese. He is not diaphoretic.     Comments: Well-appearing, comfortable, cooperative   HENT:     Head: Normocephalic and atraumatic.  Eyes:     General:        Right eye: No discharge.        Left eye: No discharge.     Conjunctiva/sclera: Conjunctivae normal.     Pupils: Pupils are equal, round, and reactive to light.  Neck:     Thyroid: No thyromegaly.     Vascular: No carotid bruit.  Cardiovascular:     Rate and Rhythm: Normal rate and regular rhythm.     Pulses: Normal pulses.     Heart sounds: Normal heart sounds. No murmur heard. Pulmonary:  Effort: Pulmonary effort is normal. No respiratory distress.     Breath sounds: Normal breath sounds. No wheezing or rales.  Abdominal:     General: Bowel sounds are normal. There is no distension.     Palpations: Abdomen is soft. There is no mass.     Tenderness: There is no abdominal tenderness.  Musculoskeletal:        General: No tenderness. Normal range of motion.     Cervical back: Normal range of motion and neck supple.     Right lower leg: No edema.     Left lower leg: No edema.     Comments: Upper / Lower Extremities: - Normal muscle tone, strength bilateral upper extremities 5/5, lower extremities 5/5  Some mild Thumb CMC pain, no edema or erythema or other joint issues on fingers  Lymphadenopathy:     Cervical: No cervical adenopathy.  Skin:    General: Skin is warm and dry.     Findings: No erythema or rash.  Neurological:     Mental Status: He is alert and oriented to person, place, and time.     Comments: Distal sensation intact to light touch all extremities  Psychiatric:        Mood and Affect: Mood normal.        Behavior: Behavior normal.        Thought Content: Thought content normal.     Comments: Well groomed, good eye contact, normal speech and thoughts     Results for orders placed or performed in visit on 02/22/20  Hemoglobin A1c  Result Value Ref Range   Hgb A1c MFr Bld 5.9 (H) 4.8 - 5.6 %   Est. average glucose Bld gHb Est-mCnc 123 mg/dL  CBC with Differential/Platelet   Result Value Ref Range   WBC 5.1 3.4 - 10.8 x10E3/uL   RBC 5.19 4.14 - 5.80 x10E6/uL   Hemoglobin 14.6 13.0 - 17.7 g/dL   Hematocrit 43.2 37.5 - 51.0 %   MCV 83 79 - 97 fL   MCH 28.1 26.6 - 33.0 pg   MCHC 33.8 31.5 - 35.7 g/dL   RDW 14.5 11.6 - 15.4 %   Platelets 305 150 - 450 x10E3/uL   Neutrophils 28 Not Estab. %   Lymphs 60 Not Estab. %   Monocytes 9 Not Estab. %   Eos 2 Not Estab. %   Basos 1 Not Estab. %   Neutrophils Absolute 1.4 1.4 - 7.0 x10E3/uL   Lymphocytes Absolute 3.1 0.7 - 3.1 x10E3/uL   Monocytes Absolute 0.5 0.1 - 0.9 x10E3/uL   EOS (ABSOLUTE) 0.1 0.0 - 0.4 x10E3/uL   Basophils Absolute 0.0 0.0 - 0.2 x10E3/uL   Immature Granulocytes 0 Not Estab. %   Immature Grans (Abs) 0.0 0.0 - 0.1 x10E3/uL  Lipid panel  Result Value Ref Range   Cholesterol, Total 184 100 - 199 mg/dL   Triglycerides 127 0 - 149 mg/dL   HDL 37 (L) >39 mg/dL   VLDL Cholesterol Cal 23 5 - 40 mg/dL   LDL Chol Calc (NIH) 124 (H) 0 - 99 mg/dL   Chol/HDL Ratio 5.0 0.0 - 5.0 ratio  PSA  Result Value Ref Range   Prostate Specific Ag, Serum 2.2 0.0 - 4.0 ng/mL  Comprehensive metabolic panel  Result Value Ref Range   Glucose 103 (H) 65 - 99 mg/dL   BUN 9 6 - 24 mg/dL   Creatinine, Ser 0.98 0.76 - 1.27 mg/dL   GFR calc non Af Wyvonnia Lora  88 >59 mL/min/1.73   GFR calc Af Amer 102 >59 mL/min/1.73   BUN/Creatinine Ratio 9 9 - 20   Sodium 136 134 - 144 mmol/L   Potassium 4.3 3.5 - 5.2 mmol/L   Chloride 98 96 - 106 mmol/L   CO2 23 20 - 29 mmol/L   Calcium 9.6 8.7 - 10.2 mg/dL   Total Protein 7.5 6.0 - 8.5 g/dL   Albumin 4.4 3.8 - 4.9 g/dL   Globulin, Total 3.1 1.5 - 4.5 g/dL   Albumin/Globulin Ratio 1.4 1.2 - 2.2   Bilirubin Total 0.4 0.0 - 1.2 mg/dL   Alkaline Phosphatase 85 44 - 121 IU/L   AST 29 0 - 40 IU/L   ALT 41 0 - 44 IU/L  Hepatitis C antibody  Result Value Ref Range   Hep C Virus Ab <0.1 0.0 - 0.9 s/co ratio      Assessment & Plan:   Problem List Items Addressed This Visit      Screening for prostate cancer   Relevant Orders   PSA   Pre-diabetes    Due for lab, prior A1c 5.9 Encourage lifestyle modification      Relevant Orders   Hemoglobin A1c   Morbid obesity with BMI of 40.0-44.9, adult (HCC)    Wt up by 1 lb in past 7 months Encourage lifestyle diet exercise      Relevant Orders   Lipid panel   CBC with Differential/Platelet   Hyperlipidemia    Due for repeat fasting lipid now, orders in Previously mild elevated LDL 110 > 104 Last lipid panel 2020 - due for repeat The 10-year ASCVD risk score (Arnett DK, et al., 2019) is: 11.3%  Plan: 1. Consider Statin therapy reduce ASCVD risk 2. Encourage improved lifestyle - low carb/cholesterol, reduce portion size, continue improving regular exercise as tolerated with knee  Will check lipid and follow-up based on results      Relevant Medications   valsartan-hydrochlorothiazide (DIOVAN-HCT) 160-25 MG tablet   Other Relevant Orders   Lipid panel   Essential hypertension    Controlled HTN Home reading: reportedly normal No known complication  Plan: 1. Continue current meds: Valsartan -HCTZ 160-434m daily, Amlodipine 2.545mdaily - add refills 2. Encouraged improve lifestyle mods with diet and in future can improve regular exercise (as tolerated by knee) 3. Monitor BP outside office      Relevant Medications   valsartan-hydrochlorothiazide (DIOVAN-HCT) 160-25 MG tablet   Other Relevant Orders   Lipid panel   CBC with Differential/Platelet   Comprehensive metabolic panel   Benign prostatic hyperplasia with weak urinary stream    Improved on alpha blocker Consistent clinically with persistent diagnosis BPH, with lower urinary tract symptoms (LUTS) - AUA BPH score 11-15 in past - Last PSA 1.8 (2020) - due today for lab - Last DRE 2 year ago reported mild enlarged - No known personal/family history of prostate CA  Plan: 1. Continue Tamsulosin 0.34m36maily  Future advised would consider other  treatment options such as switch alpha blocker vs finasteride vs Urology referral - because he may need PVR check and consider OAB treatment instead  Also MAY DC HCTZ in his combo pill for BP if this thiazide is causing too much urinary frequency      Relevant Medications   tamsulosin (FLOMAX) 0.4 MG CAPS capsule   Other Relevant Orders   PSA   Other Visit Diagnoses     Annual physical exam    -  Primary  Relevant Orders   Lipid panel   CBC with Differential/Platelet   Hemoglobin A1c   Needs flu shot       Relevant Orders   Flu Vaccine QUAD 56moIM (Fluarix, Fluzone & Alfiuria Quad PF)   Joint pain in fingers of both hands       Relevant Orders   Rheumatoid Factor   CYCLIC CITRUL PEPTIDE ANTIBODY, IGG/IGA   Need for immunization against influenza       Relevant Orders   Flu Vaccine QUAD 633moM (Fluarix, Fluzone & Alfiuria Quad PF) (Completed)       Updated Health Maintenance information Fasting labs ordered for [facility] Encouraged improvement to lifestyle with diet and exercise Goal of weight loss   BPH Improved on Tamsulosin 0.55m73maily QAM, not taking x 2 doses.   Meds ordered this encounter  Medications   valsartan-hydrochlorothiazide (DIOVAN-HCT) 160-25 MG tablet    Sig: Take 1 tablet by mouth daily.    Dispense:  90 tablet    Refill:  3   tamsulosin (FLOMAX) 0.4 MG CAPS capsule    Sig: Take 1 capsule (0.4 mg total) by mouth daily after breakfast.    Dispense:  90 capsule    Refill:  3    DX Code Needed  . N40.1      Follow up plan: Return in about 1 year (around 05/05/2022) for 1 year Annual Physical ([facility] orders after).  AleNobie PutnamO [place]oup 05/05/2021, 1:36 PM

## 2021-05-05 NOTE — Assessment & Plan Note (Addendum)
Wt up by 1 lb in past 7 months Encourage lifestyle diet exercise

## 2021-05-09 DIAGNOSIS — N401 Enlarged prostate with lower urinary tract symptoms: Secondary | ICD-10-CM | POA: Diagnosis not present

## 2021-05-09 DIAGNOSIS — R3912 Poor urinary stream: Secondary | ICD-10-CM | POA: Diagnosis not present

## 2021-05-09 DIAGNOSIS — Z0000 Encounter for general adult medical examination without abnormal findings: Secondary | ICD-10-CM | POA: Diagnosis not present

## 2021-05-09 DIAGNOSIS — I10 Essential (primary) hypertension: Secondary | ICD-10-CM | POA: Diagnosis not present

## 2021-05-09 DIAGNOSIS — Z6841 Body Mass Index (BMI) 40.0 and over, adult: Secondary | ICD-10-CM | POA: Diagnosis not present

## 2021-05-09 DIAGNOSIS — Z125 Encounter for screening for malignant neoplasm of prostate: Secondary | ICD-10-CM | POA: Diagnosis not present

## 2021-05-09 DIAGNOSIS — E782 Mixed hyperlipidemia: Secondary | ICD-10-CM | POA: Diagnosis not present

## 2021-05-09 DIAGNOSIS — R7303 Prediabetes: Secondary | ICD-10-CM | POA: Diagnosis not present

## 2021-05-11 LAB — LIPID PANEL
Chol/HDL Ratio: 5 ratio (ref 0.0–5.0)
Cholesterol, Total: 179 mg/dL (ref 100–199)
HDL: 36 mg/dL — ABNORMAL LOW (ref >39)
LDL Chol Calc (NIH): 124 mg/dL — ABNORMAL HIGH (ref 0–99)
Triglycerides: 102 mg/dL (ref 0–149)
VLDL Cholesterol Cal: 19 mg/dL (ref 5–40)

## 2021-05-11 LAB — CBC WITH DIFFERENTIAL/PLATELET
Basophils Absolute: 0.1 10*3/uL (ref 0.0–0.2)
Basos: 1 %
EOS (ABSOLUTE): 0.1 10*3/uL (ref 0.0–0.4)
Eos: 1 %
Hematocrit: 42 % (ref 37.5–51.0)
Hemoglobin: 14.5 g/dL (ref 13.0–17.7)
Immature Grans (Abs): 0 10*3/uL (ref 0.0–0.1)
Immature Granulocytes: 0 %
Lymphocytes Absolute: 3.1 10*3/uL (ref 0.7–3.1)
Lymphs: 47 %
MCH: 29.1 pg (ref 26.6–33.0)
MCHC: 34.5 g/dL (ref 31.5–35.7)
MCV: 84 fL (ref 79–97)
Monocytes Absolute: 0.6 10*3/uL (ref 0.1–0.9)
Monocytes: 9 %
Neutrophils Absolute: 2.7 10*3/uL (ref 1.4–7.0)
Neutrophils: 42 %
Platelets: 309 10*3/uL (ref 150–450)
RBC: 4.99 x10E6/uL (ref 4.14–5.80)
RDW: 15.1 % (ref 11.6–15.4)
WBC: 6.5 10*3/uL (ref 3.4–10.8)

## 2021-05-11 LAB — COMPREHENSIVE METABOLIC PANEL
ALT: 84 IU/L — ABNORMAL HIGH (ref 0–44)
AST: 47 IU/L — ABNORMAL HIGH (ref 0–40)
Albumin/Globulin Ratio: 1.4 (ref 1.2–2.2)
Albumin: 4.5 g/dL (ref 3.8–4.9)
Alkaline Phosphatase: 86 IU/L (ref 44–121)
BUN/Creatinine Ratio: 12 (ref 9–20)
BUN: 11 mg/dL (ref 6–24)
Bilirubin Total: 0.4 mg/dL (ref 0.0–1.2)
CO2: 24 mmol/L (ref 20–29)
Calcium: 9.1 mg/dL (ref 8.7–10.2)
Chloride: 102 mmol/L (ref 96–106)
Creatinine, Ser: 0.92 mg/dL (ref 0.76–1.27)
Globulin, Total: 3.3 g/dL (ref 1.5–4.5)
Glucose: 75 mg/dL (ref 70–99)
Potassium: 4.5 mmol/L (ref 3.5–5.2)
Sodium: 140 mmol/L (ref 134–144)
Total Protein: 7.8 g/dL (ref 6.0–8.5)
eGFR: 99 mL/min/{1.73_m2} (ref >59)

## 2021-05-11 LAB — HEMOGLOBIN A1C
Est. average glucose Bld gHb Est-mCnc: 120 mg/dL
Hgb A1c MFr Bld: 5.8 % — ABNORMAL HIGH (ref 4.8–5.6)

## 2021-05-11 LAB — RHEUMATOID FACTOR: Rheumatoid fact SerPl-aCnc: <10 IU/mL (ref <14.0)

## 2021-05-11 LAB — CYCLIC CITRUL PEPTIDE ANTIBODY, IGG/IGA: Cyclic Citrullin Peptide Ab: 7 units (ref 0–19)

## 2021-05-11 LAB — PSA: Prostate Specific Ag, Serum: 2.4 ng/mL (ref 0.0–4.0)

## 2021-06-18 ENCOUNTER — Other Ambulatory Visit: Payer: Self-pay | Admitting: Family Medicine

## 2021-06-18 DIAGNOSIS — I10 Essential (primary) hypertension: Secondary | ICD-10-CM

## 2021-06-19 NOTE — Telephone Encounter (Signed)
Requested Prescriptions  Pending Prescriptions Disp Refills   amLODipine (NORVASC) 2.5 MG tablet [Pharmacy Med Name: AMLODIPINE BESYLATE 2.5 MG TAB] 90 tablet 0    Sig: TAKE 1 TABLET BY MOUTH EVERY DAY     Cardiovascular: Calcium Channel Blockers 2 Passed - 06/18/2021 12:50 AM      Passed - Last BP in normal range    BP Readings from Last 1 Encounters:  05/05/21 132/82         Passed - Last Heart Rate in normal range    Pulse Readings from Last 1 Encounters:  05/05/21 92         Passed - Valid encounter within last 6 months    Recent Outpatient Visits          1 month ago Annual physical exam   [facility] Donovan, Netta Neat, DO   8 months ago Plantar fasciitis, bilateral   [facility] [place], Netta Neat, DO   1 year ago Annual physical exam   [facility] Smitty Cords, DO   1 year ago Primary osteoarthritis of right knee   [facility] [place], Netta Neat, DO   2 years ago Annual physical exam   [facility], Netta Neat, DO

## 2021-07-18 DIAGNOSIS — M25532 Pain in left wrist: Secondary | ICD-10-CM | POA: Diagnosis not present

## 2021-07-18 DIAGNOSIS — M778 Other enthesopathies, not elsewhere classified: Secondary | ICD-10-CM | POA: Diagnosis not present

## 2021-07-18 DIAGNOSIS — M19132 Post-traumatic osteoarthritis, left wrist: Secondary | ICD-10-CM | POA: Diagnosis not present

## 2021-10-09 DIAGNOSIS — M19132 Post-traumatic osteoarthritis, left wrist: Secondary | ICD-10-CM | POA: Diagnosis not present

## 2021-10-30 ENCOUNTER — Other Ambulatory Visit: Payer: Self-pay | Admitting: Family Medicine

## 2021-10-30 DIAGNOSIS — I10 Essential (primary) hypertension: Secondary | ICD-10-CM

## 2021-12-07 ENCOUNTER — Ambulatory Visit: Payer: BC Managed Care – PPO | Admitting: Family Medicine

## 2021-12-07 ENCOUNTER — Other Ambulatory Visit: Payer: Self-pay | Admitting: Family Medicine

## 2021-12-07 ENCOUNTER — Encounter: Payer: Self-pay | Admitting: Family Medicine

## 2021-12-07 VITALS — BP 126/72 | HR 74 | Ht 68.0 in | Wt 280.0 lb

## 2021-12-07 DIAGNOSIS — E782 Mixed hyperlipidemia: Secondary | ICD-10-CM

## 2021-12-07 DIAGNOSIS — N401 Enlarged prostate with lower urinary tract symptoms: Secondary | ICD-10-CM

## 2021-12-07 DIAGNOSIS — R7303 Prediabetes: Secondary | ICD-10-CM

## 2021-12-07 DIAGNOSIS — I10 Essential (primary) hypertension: Secondary | ICD-10-CM

## 2021-12-07 DIAGNOSIS — Z6841 Body Mass Index (BMI) 40.0 and over, adult: Secondary | ICD-10-CM

## 2021-12-07 DIAGNOSIS — Z0000 Encounter for general adult medical examination without abnormal findings: Secondary | ICD-10-CM

## 2021-12-07 MED ORDER — AMLODIPINE BESYLATE 2.5 MG PO TABS: 2.5000 mg | ORAL_TABLET | Freq: Every day | ORAL | 3 refills | Status: DC

## 2021-12-07 NOTE — Patient Instructions (Addendum)
Thank you for coming to the office today.  BP repeat is normal today. Keep on current medications  Schedule a virtual visit or return sooner to discuss the weight.  ---------- Call insurance find cost and coverage of the following - check the following: - Drug Tier, Preferred List, On Formulary - All will require a "Prior Authorization" from [place] first, before you can find out the cost - Find out if there is "Step Therapy" (other medicines required before you can try these)  Once you pick the one you want to try, let me know - we can get a sample ready IF we have it in stock. Then try it - and before running out of medicine, contact me back to order your Rx so we have time to get it processed.  For Weight Loss / Obesity only  Wegovy (same as Ozempic) weekly injection - start 0.25mg  weekly, 1 dose per pen, single use, auto-injector  2. Saxenda - DAILY injection - start 0.6mg  injection DAILY, you can increase the dose by 1 notch or 0.6 mg per week, if you don't tolerate a dose, can reduce it the next day.  3. Contrave - oral medication, appetite suppression has wellbutrin/bupropion and naltrexone in it and it can also help with appetite, it is ordered through a speciality pharmacy.   Future make sure your insurance has weight loss coverage   DUE for FASTING BLOOD WORK (no food or drink after midnight before the lab appointment, only water or coffee without cream/sugar on the morning of)  SCHEDULE "Lab Only" visit in the morning at the clinic for lab draw in 4 MONTHS   - Make sure Lab Only appointment is at about 1 week before your next appointment, so that results will be available  For Lab Results, once available within 2-3 days of blood draw, you can can log in to MyChart online to view your results and a brief explanation. Also, we can discuss results at next follow-up visit.    Please schedule a Follow-up Appointment to: Return if symptoms worsen or fail to improve, for return  sooner for weight loss / BP. Next f/u is Dec 2023 for Annual Phys.  If you have any other questions or concerns, please feel free to call the office or send a message through MyChart. You may also schedule an earlier appointment if necessary.  Additionally, you may be receiving a survey about your experience at our office within a few days to 1 week by e-mail or mail. We value your feedback.  Saralyn Pilar, DO [facility], [place]

## 2021-12-07 NOTE — Progress Notes (Signed)
Subjective:    Patient ID: Richard Wheeler, male    DOB: Mar 17, 1966, 56 y.o.   MRN: [id number]  Richard Wheeler is a 56 y.o. male presenting on 12/07/2021 for Hypertension   HPI  CHRONIC HTN / Morbid Obesity BMI >42 - Interval update with BP at home readings in normal range.  He has had biometric screen with elevated BP 125/98 they sent him with form for him to return here for evaluation.  Current meds: Valsartan-HCTZ 160-24m daily, Amlodipine 2.53mReports good compliance, took meds today. Tolerating well, w/o complaints. Lifestyle Weight stable - Diet: not always following appropriate diet - goal to improve - Exercise: goal to improve exercise regimen, now back on track Denies CP, dyspnea, HA, edema, dizziness / lightheadedness  His home BP readings 128/85       05/05/2021    1:26 PM 09/26/2020    3:50 PM 02/22/2020    3:59 PM  Depression screen PHQ 2/9  Decreased Interest 0 1 0  Down, Depressed, Hopeless 0 0 0  PHQ - 2 Score 0 1 0  Altered sleeping 1 1   Tired, decreased energy 0 0   Change in appetite 0 0   Feeling bad or failure about yourself  0 0   Trouble concentrating 0 0   Moving slowly or fidgety/restless 0 0   Suicidal thoughts 0 0   PHQ-9 Score 1 2   Difficult doing work/chores Not difficult at all Not difficult at all     Social History   Tobacco Use   Smoking status: Never   Smokeless tobacco: Never  Vaping Use   Vaping Use: Never used  Substance Use Topics   Alcohol use: No   Drug use: No    Review of Systems Per HPI unless specifically indicated above     Objective:    BP 126/72 (BP Location: Left Arm, Cuff Size: Large)   Pulse 74   Ht '5\' 8"'  (1.727 m)   Wt 280 lb (127 kg)   SpO2 99%   BMI 42.57 kg/m   Wt Readings from Last 3 Encounters:  12/07/21 280 lb (127 kg)  05/05/21 281 lb (127.5 kg)  09/26/20 280 lb 6.4 oz (127.2 kg)    Physical Exam Vitals and nursing note reviewed.  Constitutional:      General: He is not in  acute distress.    Appearance: Normal appearance. He is well-developed. He is obese. He is not diaphoretic.     Comments: Well-appearing, comfortable, cooperative  HENT:     Head: Normocephalic and atraumatic.  Eyes:     General:        Right eye: No discharge.        Left eye: No discharge.     Conjunctiva/sclera: Conjunctivae normal.  Cardiovascular:     Rate and Rhythm: Normal rate.  Pulmonary:     Effort: Pulmonary effort is normal.  Skin:    General: Skin is warm and dry.     Findings: No erythema or rash.  Neurological:     Mental Status: He is alert and oriented to person, place, and time.  Psychiatric:        Mood and Affect: Mood normal.        Behavior: Behavior normal.        Thought Content: Thought content normal.     Comments: Well groomed, good eye contact, normal speech and thoughts       Results for orders placed or performed in visit  on 05/05/21  Lipid panel  Result Value Ref Range   Cholesterol, Total 179 100 - 199 mg/dL   Triglycerides 102 0 - 149 mg/dL   HDL 36 (L) >39 mg/dL   VLDL Cholesterol Cal 19 5 - 40 mg/dL   LDL Chol Calc (NIH) 124 (H) 0 - 99 mg/dL   Chol/HDL Ratio 5.0 0.0 - 5.0 ratio  CBC with Differential/Platelet  Result Value Ref Range   WBC 6.5 3.4 - 10.8 x10E3/uL   RBC 4.99 4.14 - 5.80 x10E6/uL   Hemoglobin 14.5 13.0 - 17.7 g/dL   Hematocrit 42.0 37.5 - 51.0 %   MCV 84 79 - 97 fL   MCH 29.1 26.6 - 33.0 pg   MCHC 34.5 31.5 - 35.7 g/dL   RDW 15.1 11.6 - 15.4 %   Platelets 309 150 - 450 x10E3/uL   Neutrophils 42 Not Estab. %   Lymphs 47 Not Estab. %   Monocytes 9 Not Estab. %   Eos 1 Not Estab. %   Basos 1 Not Estab. %   Neutrophils Absolute 2.7 1.4 - 7.0 x10E3/uL   Lymphocytes Absolute 3.1 0.7 - 3.1 x10E3/uL   Monocytes Absolute 0.6 0.1 - 0.9 x10E3/uL   EOS (ABSOLUTE) 0.1 0.0 - 0.4 x10E3/uL   Basophils Absolute 0.1 0.0 - 0.2 x10E3/uL   Immature Granulocytes 0 Not Estab. %   Immature Grans (Abs) 0.0 0.0 - 0.1 x10E3/uL   Hemoglobin A1c  Result Value Ref Range   Hgb A1c MFr Bld 5.8 (H) 4.8 - 5.6 %   Est. average glucose Bld gHb Est-mCnc 120 mg/dL  PSA  Result Value Ref Range   Prostate Specific Ag, Serum 2.4 0.0 - 4.0 ng/mL  Rheumatoid Factor  Result Value Ref Range   Rhuematoid fact SerPl-aCnc <10.0 <77.8 IU/mL  CYCLIC CITRUL PEPTIDE ANTIBODY, IGG/IGA  Result Value Ref Range   Cyclic Citrullin Peptide Ab 7 0 - 19 units  Comprehensive metabolic panel  Result Value Ref Range   Glucose 75 70 - 99 mg/dL   BUN 11 6 - 24 mg/dL   Creatinine, Ser 0.92 0.76 - 1.27 mg/dL   eGFR 99 >59 mL/min/1.73   BUN/Creatinine Ratio 12 9 - 20   Sodium 140 134 - 144 mmol/L   Potassium 4.5 3.5 - 5.2 mmol/L   Chloride 102 96 - 106 mmol/L   CO2 24 20 - 29 mmol/L   Calcium 9.1 8.7 - 10.2 mg/dL   Total Protein 7.8 6.0 - 8.5 g/dL   Albumin 4.5 3.8 - 4.9 g/dL   Globulin, Total 3.3 1.5 - 4.5 g/dL   Albumin/Globulin Ratio 1.4 1.2 - 2.2   Bilirubin Total 0.4 0.0 - 1.2 mg/dL   Alkaline Phosphatase 86 44 - 121 IU/L   AST 47 (H) 0 - 40 IU/L   ALT 84 (H) 0 - 44 IU/L      Assessment & Plan:   Problem List Items Addressed This Visit     Essential hypertension    Controlled HTN,  Initial elevated, manual repeat normal. Seems electronic cuff was inaccurate. Home reading: reportedly normal No known complication  Note he had high reading DBP 98 when done by electronic wrist cuff at his recent employer biometric  Plan: 1. Continue current meds: Valsartan -HCTZ 160-97m daily, Amlodipine 2.559mdaily, no med change today. 2. Encouraged improve lifestyle mods with diet and in future can improve regular exercise (as tolerated by knee) 3. Monitor BP outside office      Relevant Medications  amLODipine (NORVASC) 2.5 MG tablet   Morbid obesity with BMI of 40.0-44.9, adult ([place]) - Primary    Meds ordered this encounter  Medications   amLODipine (NORVASC) 2.5 MG tablet    Sig: Take 1 tablet (2.5 mg total) by mouth daily.     Dispense:  90 tablet    Refill:  3    Add refills      Follow up plan: Return if symptoms worsen or fail to improve, for return sooner for weight loss / BP. Next f/u is Dec 2023 for Annual Phys.  Future labs ordered for [facility] 04/2022  Richard Putnam, DO [facility] 12/07/2021, 12:06 PM

## 2021-12-07 NOTE — Assessment & Plan Note (Signed)
Controlled HTN,  Initial elevated, manual repeat normal. Seems electronic cuff was inaccurate. Home reading: reportedly normal No known complication  Note he had high reading DBP 98 when done by electronic wrist cuff at his recent employer biometric  Plan: 1. Continue current meds: Valsartan -HCTZ 160-25mg  daily, Amlodipine 2.5mg  daily, no med change today. 2. Encouraged improve lifestyle mods with diet and in future can improve regular exercise (as tolerated by knee) 3. Monitor BP outside office

## 2021-12-11 DIAGNOSIS — M778 Other enthesopathies, not elsewhere classified: Secondary | ICD-10-CM | POA: Diagnosis not present

## 2021-12-11 DIAGNOSIS — M19132 Post-traumatic osteoarthritis, left wrist: Secondary | ICD-10-CM | POA: Diagnosis not present

## 2021-12-14 DIAGNOSIS — G8918 Other acute postprocedural pain: Secondary | ICD-10-CM | POA: Diagnosis not present

## 2021-12-14 DIAGNOSIS — M19132 Post-traumatic osteoarthritis, left wrist: Secondary | ICD-10-CM | POA: Diagnosis not present

## 2021-12-14 DIAGNOSIS — I10 Essential (primary) hypertension: Secondary | ICD-10-CM | POA: Diagnosis not present

## 2021-12-14 DIAGNOSIS — Z6841 Body Mass Index (BMI) 40.0 and over, adult: Secondary | ICD-10-CM | POA: Diagnosis not present

## 2021-12-14 DIAGNOSIS — E669 Obesity, unspecified: Secondary | ICD-10-CM | POA: Diagnosis not present

## 2021-12-14 DIAGNOSIS — M654 Radial styloid tenosynovitis [de Quervain]: Secondary | ICD-10-CM | POA: Diagnosis not present

## 2021-12-29 DIAGNOSIS — Z4789 Encounter for other orthopedic aftercare: Secondary | ICD-10-CM | POA: Diagnosis not present

## 2021-12-29 DIAGNOSIS — M25632 Stiffness of left wrist, not elsewhere classified: Secondary | ICD-10-CM | POA: Diagnosis not present

## 2021-12-29 DIAGNOSIS — M778 Other enthesopathies, not elsewhere classified: Secondary | ICD-10-CM | POA: Diagnosis not present

## 2021-12-29 DIAGNOSIS — M25532 Pain in left wrist: Secondary | ICD-10-CM | POA: Diagnosis not present

## 2021-12-29 DIAGNOSIS — M25642 Stiffness of left hand, not elsewhere classified: Secondary | ICD-10-CM | POA: Diagnosis not present

## 2021-12-29 DIAGNOSIS — M19132 Post-traumatic osteoarthritis, left wrist: Secondary | ICD-10-CM | POA: Diagnosis not present

## 2022-01-31 DIAGNOSIS — M25632 Stiffness of left wrist, not elsewhere classified: Secondary | ICD-10-CM | POA: Diagnosis not present

## 2022-01-31 DIAGNOSIS — Z4789 Encounter for other orthopedic aftercare: Secondary | ICD-10-CM | POA: Diagnosis not present

## 2022-01-31 DIAGNOSIS — Z4889 Encounter for other specified surgical aftercare: Secondary | ICD-10-CM | POA: Diagnosis not present

## 2022-01-31 DIAGNOSIS — M25532 Pain in left wrist: Secondary | ICD-10-CM | POA: Diagnosis not present

## 2022-01-31 DIAGNOSIS — M25642 Stiffness of left hand, not elsewhere classified: Secondary | ICD-10-CM | POA: Diagnosis not present

## 2022-04-03 ENCOUNTER — Other Ambulatory Visit: Payer: Self-pay

## 2022-04-03 DIAGNOSIS — N401 Enlarged prostate with lower urinary tract symptoms: Secondary | ICD-10-CM

## 2022-04-03 DIAGNOSIS — I10 Essential (primary) hypertension: Secondary | ICD-10-CM

## 2022-04-03 MED ORDER — TAMSULOSIN HCL 0.4 MG PO CAPS: 0.4000 mg | ORAL_CAPSULE | Freq: Every day | ORAL | 3 refills | Status: DC

## 2022-04-03 MED ORDER — AMLODIPINE BESYLATE 2.5 MG PO TABS: 2.5000 mg | ORAL_TABLET | Freq: Every day | ORAL | 3 refills | Status: DC

## 2022-04-03 MED ORDER — VALSARTAN-HYDROCHLOROTHIAZIDE 160-25 MG PO TABS: 1.0000 | ORAL_TABLET | Freq: Every day | ORAL | 3 refills | Status: DC

## 2022-04-21 ENCOUNTER — Other Ambulatory Visit: Payer: Self-pay | Admitting: Family Medicine

## 2022-04-21 DIAGNOSIS — N401 Enlarged prostate with lower urinary tract symptoms: Secondary | ICD-10-CM

## 2022-04-23 NOTE — Telephone Encounter (Signed)
Requested Prescriptions  Pending Prescriptions Disp Refills   tamsulosin (FLOMAX) 0.4 MG CAPS capsule [Pharmacy Med Name: TAMSULOSIN HCL 0.4 MG CAPSULE] 90 capsule 0    Sig: Take 1 capsule (0.4 mg total) by mouth daily after breakfast.     Urology: Alpha-Adrenergic Blocker Passed - 04/21/2022 12:31 PM      Passed - PSA in normal range and within 360 days    Prostate Specific Ag, Serum  Date Value Ref Range Status  05/09/2021 2.4 0.0 - 4.0 ng/mL Final    Comment:    Roche ECLIA methodology. According to the American Urological Association, Serum PSA should decrease and remain at undetectable levels after radical prostatectomy. The AUA defines biochemical recurrence as an initial PSA value 0.2 ng/mL or greater followed by a subsequent confirmatory PSA value 0.2 ng/mL or greater. Values obtained with different assay methods or kits cannot be used interchangeably. Results cannot be interpreted as absolute evidence of the presence or absence of malignant disease.          Passed - Last BP in normal range    BP Readings from Last 1 Encounters:  12/07/21 126/72         Passed - Valid encounter within last 12 months    Recent Outpatient Visits           4 months ago Morbid obesity with BMI of 40.0-44.9, adult [facility])   [facility] Smitty Cords, DO   11 months ago Annual physical exam   [facility] Smitty Cords, DO   1 year ago Plantar fasciitis, bilateral   [facility] Smitty Cords, DO   2 years ago Annual physical exam   [facility] Smitty Cords, DO   2 years ago Primary osteoarthritis of right knee   [facility], [place], [place]

## 2022-05-16 ENCOUNTER — Encounter: Payer: Self-pay | Admitting: Family Medicine

## 2022-05-16 ENCOUNTER — Ambulatory Visit (INDEPENDENT_AMBULATORY_CARE_PROVIDER_SITE_OTHER): Payer: BC Managed Care – PPO | Admitting: Family Medicine

## 2022-05-16 VITALS — BP 132/76 | HR 90 | Temp 96.9°F | Ht 68.0 in | Wt 278.0 lb

## 2022-05-16 DIAGNOSIS — Z0000 Encounter for general adult medical examination without abnormal findings: Secondary | ICD-10-CM | POA: Diagnosis not present

## 2022-05-16 DIAGNOSIS — Z6841 Body Mass Index (BMI) 40.0 and over, adult: Secondary | ICD-10-CM

## 2022-05-16 DIAGNOSIS — N401 Enlarged prostate with lower urinary tract symptoms: Secondary | ICD-10-CM

## 2022-05-16 DIAGNOSIS — R7303 Prediabetes: Secondary | ICD-10-CM

## 2022-05-16 DIAGNOSIS — I10 Essential (primary) hypertension: Secondary | ICD-10-CM

## 2022-05-16 DIAGNOSIS — R3912 Poor urinary stream: Secondary | ICD-10-CM

## 2022-05-16 DIAGNOSIS — E782 Mixed hyperlipidemia: Secondary | ICD-10-CM

## 2022-05-16 MED ORDER — TAMSULOSIN HCL 0.4 MG PO CAPS: 0.4000 mg | ORAL_CAPSULE | Freq: Every day | ORAL | 3 refills | Status: DC

## 2022-05-16 NOTE — Patient Instructions (Addendum)
Thank you for coming to the office today.  Stay tuned for lab results.  Keep on Flomax 0.4mg  daily, if you decide to double, consider taking 2 at once if you prefer.  You are at increased risk of future Cardiovascular complications such as Heart Attack or Stroke from an artery blockage due to abnormal cholesterol and/or risk factors. - As discussed, Statin Cholesterol pills both can both LOWER cholesterol and REDUCE this future risk of heart attack and stroke  Consider this option. The risk score is around 10% chance of problem in the next 10 years  Let me know if interested to start one of these meds.  Sent rx for Tamsulosin to [facility] (also accidentally sent to [facility], tell them to cancel if they ask you)  DUE for FASTING BLOOD WORK (no food or drink after midnight before the lab appointment, only water or coffee without cream/sugar on the morning of)  SCHEDULE "Lab Only" visit in the morning at the clinic for lab draw in 1 day  - Make sure Lab Only appointment is at about 1 week before your next appointment, so that results will be available  For Lab Results, once available within 2-3 days of blood draw, you can can log in to MyChart online to view your results and a brief explanation. Also, we can discuss results at next follow-up visit.    Please schedule a Follow-up Appointment to: Return in about 6 months (around 11/14/2022) for 6 month follow-up HTN, BPH, updates.  If you have any other questions or concerns, please feel free to call the office or send a message through [place]. You may also schedule an earlier appointment if necessary.  Additionally, you may be receiving a survey about your experience at our office within a few days to 1 week by e-mail or mail. We value your feedback.  Nobie Putnam, DO [place]

## 2022-05-16 NOTE — Assessment & Plan Note (Signed)
Improved on alpha blocker Consistent clinically with persistent diagnosis BPH, with lower urinary tract symptoms (LUTS) PSA stable previously due for repeat lab - Last DRE 2 year ago reported mild enlarged - No known personal/family history of prostate CA  Plan: 1. Continue Tamsulosin 0.4mg  daily - consider double dose 0.4mg  x 2 = 0.8mg  if needed, notify office  Future advised would consider other treatment options such as switch alpha blocker vs finasteride vs Urology referral - because he may need PVR check and consider OAB treatment instead

## 2022-05-16 NOTE — Assessment & Plan Note (Signed)
Due for lab, prior A1c 5.8-5.9 range Encourage lifestyle modification

## 2022-05-16 NOTE — Assessment & Plan Note (Signed)
Controlled HYPERTENSION No known complication  Plan: 1. Continue current meds: Valsartan -HCTZ 160-25mg  daily, Amlodipine 2.5mg  daily, no med change today. 2. Encouraged improve lifestyle mods with diet and in future can improve regular exercise (as tolerated by knee) 3. Monitor BP outside office

## 2022-05-16 NOTE — Progress Notes (Signed)
Subjective:    Patient ID: Richard Wheeler, male    DOB: [birth year], 57 y.o.   MRN: [id number]  Richard Wheeler is a 57 y.o. male presenting on 05/16/2022 for Annual Exam   HPI  Here for Annual Physical and lab Review.   CHRONIC HTN / Morbid Obesity BMI >42 - Interval update with BP at home readings in normal range. - Today patient reports no new concerns Current meds: Valsartan-HCTZ 160-25mg  daily, Amlodipine 2.5mg  Reports good compliance, took meds today. Tolerating well, w/o complaints. Lifestyle Weight stable - Exercise: goal to improve exercise regimen, now back on track Denies CP, dyspnea, HA, edema, dizziness / lightheadedness    HYPERLIPIDEMIA: - Reports no concerns. Mild elevated LDL 124 Due for repeat lipids now at [facility] Not on medicine statin   BPH with LUTS He was on OTC prostate supplement PRN with mixed results - Has not seen Urologist Taking Tamsulosin 0.4mg  daily with good results Due for PSA lab   Indigestion GERD PM   Health Maintenance:   Shingles vaccine eligible     Colon CA Screening: Last Colonoscopy 04/09/18 (done by Dr Tobi Bastos [facility]), results with no polyp, good for 10 years - next due 2029. Currently asymptomatic. No known family history of colon CA.   Prostate CA Screening: Last DRE mild enlarged 2018. PSA trend previously 1.8 to 2.4 range. Currently some worsening BPH LUTS. No known family history of prostate CA.  - Due for screening lab PSA will order now.     05/16/2022    3:44 PM 05/05/2021    1:26 PM 09/26/2020    3:50 PM  Depression screen PHQ 2/9  Decreased Interest 0 0 1  Down, Depressed, Hopeless 0 0 0  PHQ - 2 Score 0 0 1  Altered sleeping  1 1  Tired, decreased energy  0 0  Change in appetite  0 0  Feeling bad or failure about yourself   0 0  Trouble concentrating  0 0  Moving slowly or fidgety/restless  0 0  Suicidal thoughts  0 0  PHQ-9 Score  1 2  Difficult doing work/chores  Not difficult at all Not difficult at all     Past Medical History:  Diagnosis Date   Infected cuticle 03/08/2015   Left Hand 4th digit    Obesity    Past Surgical History:  Procedure Laterality Date   COLONOSCOPY WITH PROPOFOL N/A 04/09/2018   Procedure: COLONOSCOPY WITH PROPOFOL;  Surgeon: Wyline Mood, MD;  Location: [facility] END[facility]COPY;  Service: Gastroenterology;  Laterality: N/A;   KNEE ARTHROSCOPY WITH MENISCAL REPAIR Right 03/19/2017   Procedure: RIGHT PARTIAL MEDIAL MENISCECTOMY;  Surgeon: Signa Kell, MD;  Location: [facility];  Service: Orthopedics;  Laterality: Right;   left knee surgery     WRIST SURGERY Left    Social History   Socioeconomic History   Marital status: Married    Spouse name: Not on file   Number of children: Not on file   Years of education: Not on file   Highest education level: Not on file  Occupational History   Not on file  Tobacco Use   Smoking status: Never   Smokeless tobacco: Never  Vaping Use   Vaping Use: Never used  Substance and Sexual Activity   Alcohol use: No   Drug use: No   Sexual activity: Not on file  Other Topics Concern   Not on file  Social History Narrative   Not on file   Social Determinants  of Health   Financial Resource Strain: Not on file  Food Insecurity: Not on file  Transportation Needs: Not on file  Physical Activity: Not on file  Stress: Not on file  Social Connections: Not on file  Intimate Partner Violence: Not on file   Family History  Problem Relation Age of Onset   Cancer Mother 84   Multiple myeloma Mother    Hypertension Father    Hypertension Sister    Healthy Brother    Prostate cancer Neg Hx    Bladder Cancer Neg Hx    Kidney cancer Neg Hx    Colon cancer Neg Hx    Current Outpatient Medications on File Prior to Visit  Medication Sig   amLODipine (NORVASC) 2.5 MG tablet Take 1 tablet (2.5 mg total) by mouth daily.   Multiple Vitamin (MULTIVITAMIN WITH MINERALS) TABS tablet Take 1 tablet by mouth daily.    valsartan-hydrochlorothiazide (DIOVAN-HCT) 160-25 MG tablet Take 1 tablet by mouth daily.   No current facility-administered medications on file prior to visit.    Review of Systems  Constitutional:  Negative for activity change, appetite change, chills, diaphoresis, fatigue and fever.  HENT:  Negative for congestion and hearing loss.   Eyes:  Negative for visual disturbance.  Respiratory:  Negative for cough, chest tightness, shortness of breath and wheezing.   Cardiovascular:  Negative for chest pain, palpitations and leg swelling.  Gastrointestinal:  Negative for abdominal pain, constipation, diarrhea, nausea and vomiting.  Genitourinary:  Negative for dysuria, frequency and hematuria.  Musculoskeletal:  Negative for arthralgias and neck pain.  Skin:  Negative for rash.  Neurological:  Negative for dizziness, weakness, light-headedness, numbness and headaches.  Hematological:  Negative for adenopathy.  Psychiatric/Behavioral:  Negative for behavioral problems, dysphoric mood and sleep disturbance.    Per HPI unless specifically indicated above      Objective:    BP 132/76 (BP Location: Right Arm, Patient Position: Sitting, Cuff Size: Large)   Pulse 90   Temp (!) 96.9 F (36.1 C) (Temporal)   Ht 5\' 8"  (1.727 m)   Wt 278 lb (126.1 kg)   SpO2 98%   BMI 42.27 kg/m   Wt Readings from Last 3 Encounters:  05/16/22 278 lb (126.1 kg)  12/07/21 280 lb (127 kg)  05/05/21 281 lb (127.5 kg)    Physical Exam Vitals and nursing note reviewed.  Constitutional:      General: He is not in acute distress.    Appearance: He is well-developed. He is not diaphoretic.     Comments: Well-appearing, comfortable, cooperative  HENT:     Head: Normocephalic and atraumatic.  Eyes:     General:        Right eye: No discharge.        Left eye: No discharge.     Conjunctiva/sclera: Conjunctivae normal.     Pupils: Pupils are equal, round, and reactive to light.  Neck:     Thyroid: No  thyromegaly.  Cardiovascular:     Rate and Rhythm: Normal rate and regular rhythm.     Pulses: Normal pulses.     Heart sounds: Normal heart sounds. No murmur heard. Pulmonary:     Effort: Pulmonary effort is normal. No respiratory distress.     Breath sounds: Normal breath sounds. No wheezing or rales.  Abdominal:     General: Bowel sounds are normal. There is no distension.     Palpations: Abdomen is soft. There is no mass.  Tenderness: There is no abdominal tenderness.  Musculoskeletal:        General: No tenderness. Normal range of motion.     Cervical back: Normal range of motion and neck supple.     Comments: Upper / Lower Extremities: - Normal muscle tone, strength bilateral upper extremities 5/5, lower extremities 5/5  Lymphadenopathy:     Cervical: No cervical adenopathy.  Skin:    General: Skin is warm and dry.     Findings: No erythema or rash.  Neurological:     Mental Status: He is alert and oriented to person, place, and time.     Comments: Distal sensation intact to light touch all extremities  Psychiatric:        Mood and Affect: Mood normal.        Behavior: Behavior normal.        Thought Content: Thought content normal.     Comments: Well groomed, good eye contact, normal speech and thoughts       Results for orders placed or performed in visit on 05/05/21  Lipid panel  Result Value Ref Range   Cholesterol, Total 179 100 - 199 mg/dL   Triglycerides 102 0 - 149 mg/dL   HDL 36 (L) >39 mg/dL   VLDL Cholesterol Cal 19 5 - 40 mg/dL   LDL Chol Calc (NIH) 124 (H) 0 - 99 mg/dL   Chol/HDL Ratio 5.0 0.0 - 5.0 ratio  CBC with Differential/Platelet  Result Value Ref Range   WBC 6.5 3.4 - 10.8 x10E3/uL   RBC 4.99 4.14 - 5.80 x10E6/uL   Hemoglobin 14.5 13.0 - 17.7 g/dL   Hematocrit 42.0 37.5 - 51.0 %   MCV 84 79 - 97 fL   MCH 29.1 26.6 - 33.0 pg   MCHC 34.5 31.5 - 35.7 g/dL   RDW 15.1 11.6 - 15.4 %   Platelets 309 150 - 450 x10E3/uL   Neutrophils 42 Not  Estab. %   Lymphs 47 Not Estab. %   Monocytes 9 Not Estab. %   Eos 1 Not Estab. %   Basos 1 Not Estab. %   Neutrophils Absolute 2.7 1.4 - 7.0 x10E3/uL   Lymphocytes Absolute 3.1 0.7 - 3.1 x10E3/uL   Monocytes Absolute 0.6 0.1 - 0.9 x10E3/uL   EOS (ABSOLUTE) 0.1 0.0 - 0.4 x10E3/uL   Basophils Absolute 0.1 0.0 - 0.2 x10E3/uL   Immature Granulocytes 0 Not Estab. %   Immature Grans (Abs) 0.0 0.0 - 0.1 x10E3/uL  Hemoglobin A1c  Result Value Ref Range   Hgb A1c MFr Bld 5.8 (H) 4.8 - 5.6 %   Est. average glucose Bld gHb Est-mCnc 120 mg/dL  PSA  Result Value Ref Range   Prostate Specific Ag, Serum 2.4 0.0 - 4.0 ng/mL  Rheumatoid Factor  Result Value Ref Range   Rhuematoid fact SerPl-aCnc <10.0 XX123456 IU/mL  CYCLIC CITRUL PEPTIDE ANTIBODY, IGG/IGA  Result Value Ref Range   Cyclic Citrullin Peptide Ab 7 0 - 19 units  Comprehensive metabolic panel  Result Value Ref Range   Glucose 75 70 - 99 mg/dL   BUN 11 6 - 24 mg/dL   Creatinine, Ser 0.92 0.76 - 1.27 mg/dL   eGFR 99 >59 mL/min/1.73   BUN/Creatinine Ratio 12 9 - 20   Sodium 140 134 - 144 mmol/L   Potassium 4.5 3.5 - 5.2 mmol/L   Chloride 102 96 - 106 mmol/L   CO2 24 20 - 29 mmol/L   Calcium 9.1 8.7 - 10.2 mg/dL  Total Protein 7.8 6.0 - 8.5 g/dL   Albumin 4.5 3.8 - 4.9 g/dL   Globulin, Total 3.3 1.5 - 4.5 g/dL   Albumin/Globulin Ratio 1.4 1.2 - 2.2   Bilirubin Total 0.4 0.0 - 1.2 mg/dL   Alkaline Phosphatase 86 44 - 121 IU/L   AST 47 (H) 0 - 40 IU/L   ALT 84 (H) 0 - 44 IU/L      Assessment & Plan:   Problem List Items Addressed This Visit     Benign prostatic hyperplasia with weak urinary stream    Improved on alpha blocker Consistent clinically with persistent diagnosis BPH, with lower urinary tract symptoms (LUTS) PSA stable previously due for repeat lab - Last DRE 2 year ago reported mild enlarged - No known personal/family history of prostate CA  Plan: 1. Continue Tamsulosin 0.4mg  daily - consider double dose  0.4mg  x 2 = 0.8mg  if needed, notify office  Future advised would consider other treatment options such as switch alpha blocker vs finasteride vs Urology referral - because he may need PVR check and consider OAB treatment instead      Relevant Medications   tamsulosin (FLOMAX) 0.4 MG CAPS capsule   Essential hypertension    Controlled HYPERTENSION No known complication  Plan: 1. Continue current meds: Valsartan -HCTZ 160-25mg  daily, Amlodipine 2.5mg  daily, no med change today. 2. Encouraged improve lifestyle mods with diet and in future can improve regular exercise (as tolerated by knee) 3. Monitor BP outside office      Hyperlipidemia    Due for repeat fasting lipid now, orders in Previously mild elevated LDL 100-120s Due for repeat lipid The 10-year ASCVD risk score (Arnett DK, et al., 2019) is: 11.8%  Plan: 1. Consider Statin therapy reduce ASCVD risk 2. Encourage improved lifestyle - low carb/cholesterol, reduce portion size, continue improving regular exercise as tolerated with knee       Morbid obesity with BMI of 40.0-44.9, adult (HCC)    Weight stable Encourage lifestyle diet exercise Consider future medication      Pre-diabetes    Due for lab, prior A1c 5.8-5.9 range Encourage lifestyle modification      Other Visit Diagnoses     Annual physical exam    -  Primary       Updated Health Maintenance information Fasting lab orders placed for [facility], printed for patient. Encouraged improvement to lifestyle with diet and exercise Goal of weight loss   Meds ordered this encounter  Medications   DISCONTD: tamsulosin (FLOMAX) 0.4 MG CAPS capsule    Sig: Take 1 capsule (0.4 mg total) by mouth daily after breakfast.    Dispense:  90 capsule    Refill:  3    Add extra refills on file   tamsulosin (FLOMAX) 0.4 MG CAPS capsule    Sig: Take 1 capsule (0.4 mg total) by mouth daily after breakfast.    Dispense:  90 capsule    Refill:  3    Add extra refills on  file      Follow up plan: Return in about 6 months (around 11/14/2022) for 6 month follow-up HTN, BPH, updates.  Nobie Putnam, [facility] 05/16/2022, 3:29 PM

## 2022-05-16 NOTE — Assessment & Plan Note (Signed)
Weight stable Encourage lifestyle diet exercise Consider future medication

## 2022-05-16 NOTE — Assessment & Plan Note (Signed)
Due for repeat fasting lipid now, orders in Previously mild elevated LDL 100-120s Due for repeat lipid The 10-year ASCVD risk score (Arnett DK, et al., 2019) is: 11.8%  Plan: 1. Consider Statin therapy reduce ASCVD risk 2. Encourage improved lifestyle - low carb/cholesterol, reduce portion size, continue improving regular exercise as tolerated with knee

## 2022-05-17 DIAGNOSIS — E782 Mixed hyperlipidemia: Secondary | ICD-10-CM | POA: Diagnosis not present

## 2022-05-17 DIAGNOSIS — Z0000 Encounter for general adult medical examination without abnormal findings: Secondary | ICD-10-CM | POA: Diagnosis not present

## 2022-05-17 DIAGNOSIS — E785 Hyperlipidemia, unspecified: Secondary | ICD-10-CM | POA: Diagnosis not present

## 2022-05-17 DIAGNOSIS — I10 Essential (primary) hypertension: Secondary | ICD-10-CM | POA: Diagnosis not present

## 2022-05-18 LAB — CBC WITH DIFFERENTIAL/PLATELET
Basophils Absolute: 0 10*3/uL (ref 0.0–0.2)
Basos: 1 %
EOS (ABSOLUTE): 0.1 10*3/uL (ref 0.0–0.4)
Eos: 1 %
Hematocrit: 43.5 % (ref 37.5–51.0)
Hemoglobin: 14.6 g/dL (ref 13.0–17.7)
Immature Grans (Abs): 0 10*3/uL (ref 0.0–0.1)
Immature Granulocytes: 0 %
Lymphocytes Absolute: 3.4 10*3/uL — ABNORMAL HIGH (ref 0.7–3.1)
Lymphs: 60 %
MCH: 27.9 pg (ref 26.6–33.0)
MCHC: 33.6 g/dL (ref 31.5–35.7)
MCV: 83 fL (ref 79–97)
Monocytes Absolute: 0.4 10*3/uL (ref 0.1–0.9)
Monocytes: 8 %
Neutrophils Absolute: 1.7 10*3/uL (ref 1.4–7.0)
Neutrophils: 30 %
Platelets: 331 10*3/uL (ref 150–450)
RBC: 5.23 x10E6/uL (ref 4.14–5.80)
RDW: 14.5 % (ref 11.6–15.4)
WBC: 5.7 10*3/uL (ref 3.4–10.8)

## 2022-05-18 LAB — LIPID PANEL
Chol/HDL Ratio: 5.4 ratio — ABNORMAL HIGH (ref 0.0–5.0)
Cholesterol, Total: 177 mg/dL (ref 100–199)
HDL: 33 mg/dL — ABNORMAL LOW (ref >39)
LDL Chol Calc (NIH): 125 mg/dL — ABNORMAL HIGH (ref 0–99)
Triglycerides: 101 mg/dL (ref 0–149)
VLDL Cholesterol Cal: 19 mg/dL (ref 5–40)

## 2022-05-18 LAB — COMPREHENSIVE METABOLIC PANEL
ALT: 65 IU/L — ABNORMAL HIGH (ref 0–44)
AST: 41 IU/L — ABNORMAL HIGH (ref 0–40)
Albumin/Globulin Ratio: 1.4 (ref 1.2–2.2)
Albumin: 4.6 g/dL (ref 3.8–4.9)
Alkaline Phosphatase: 90 IU/L (ref 44–121)
BUN/Creatinine Ratio: 9 (ref 9–20)
BUN: 9 mg/dL (ref 6–24)
Bilirubin Total: 0.4 mg/dL (ref 0.0–1.2)
CO2: 25 mmol/L (ref 20–29)
Calcium: 9.8 mg/dL (ref 8.7–10.2)
Chloride: 98 mmol/L (ref 96–106)
Creatinine, Ser: 0.96 mg/dL (ref 0.76–1.27)
Globulin, Total: 3.4 g/dL (ref 1.5–4.5)
Glucose: 87 mg/dL (ref 70–99)
Potassium: 4.3 mmol/L (ref 3.5–5.2)
Sodium: 136 mmol/L (ref 134–144)
Total Protein: 8 g/dL (ref 6.0–8.5)
eGFR: 94 mL/min/{1.73_m2} (ref >59)

## 2022-05-18 LAB — TSH: TSH: 2.31 u[IU]/mL (ref 0.450–4.500)

## 2022-05-18 LAB — HEMOGLOBIN A1C
Est. average glucose Bld gHb Est-mCnc: 114 mg/dL
Hgb A1c MFr Bld: 5.6 % (ref 4.8–5.6)

## 2022-05-18 LAB — PSA: Prostate Specific Ag, Serum: 1.6 ng/mL (ref 0.0–4.0)

## 2022-06-12 ENCOUNTER — Encounter: Payer: Self-pay | Admitting: Family Medicine

## 2022-06-12 ENCOUNTER — Ambulatory Visit
Admission: RE | Admit: 2022-06-12 | Discharge: 2022-06-12 | Disposition: A | Discharge status: Home / Self Care | Payer: BC Managed Care – PPO | Source: Ambulatory Visit | Attending: Family Medicine | Admitting: Family Medicine

## 2022-06-12 ENCOUNTER — Ambulatory Visit
Admission: RE | Admit: 2022-06-12 | Discharge: 2022-06-12 | Disposition: A | Discharge status: Home / Self Care | Payer: BC Managed Care – PPO | Attending: Family Medicine | Admitting: Family Medicine

## 2022-06-12 ENCOUNTER — Ambulatory Visit: Payer: BC Managed Care – PPO | Admitting: Family Medicine

## 2022-06-12 VITALS — BP 120/80 | HR 88 | Ht 67.0 in | Wt 272.0 lb

## 2022-06-12 DIAGNOSIS — R1011 Right upper quadrant pain: Secondary | ICD-10-CM

## 2022-06-12 DIAGNOSIS — K219 Gastro-esophageal reflux disease without esophagitis: Secondary | ICD-10-CM

## 2022-06-12 DIAGNOSIS — R1010 Upper abdominal pain, unspecified: Secondary | ICD-10-CM | POA: Diagnosis not present

## 2022-06-12 DIAGNOSIS — R140 Abdominal distension (gaseous): Secondary | ICD-10-CM | POA: Diagnosis not present

## 2022-06-12 DIAGNOSIS — K5900 Constipation, unspecified: Secondary | ICD-10-CM

## 2022-06-12 DIAGNOSIS — R142 Eructation: Secondary | ICD-10-CM | POA: Diagnosis not present

## 2022-06-12 MED ORDER — OMEPRAZOLE 40 MG PO CPDR: 40.0000 mg | DELAYED_RELEASE_CAPSULE | Freq: Every day | ORAL | 1 refills | Status: DC

## 2022-06-12 NOTE — Progress Notes (Signed)
Subjective:    Patient ID: Richard Wheeler, male    DOB: 05/12/1965, 57 y.o.   MRN: [id number]  Richard Wheeler is a 57 y.o. male presenting on 06/12/2022 for Flank Pain  Patient presents for a same day appointment.  HPI  Right Upper Quadrant Abdominal Pain vs Flank Pain Reports symptoms acute onset Right Abdominal Pain upper aspect and flank about 10 days ago. No known trigger. Never had this symptom before, seems to be new problem onset 10 days ago. Describes more of a pressure pain discomfort maybe max 3 out of 10. Worse if he sits forward and inc pressure in that area, improved if lays down. Walking rarely notice it radiated to his back but other physical activity is mostly unaffected. Admits belching gas. Admits some worse with eating. Admits worse with laughing. Admits worse with deep breathing. Tried Tylenol x 2 pills this morning. No other meds tried. Admits some reduced appetite. But still able to eat a regular meals. Just not eating as much. Denies any urinary dysfunction (no blood, frequency, obstruction) Denies any fever, aches, cough chills, nausea vomiting diarrhea   Past Surgical History:  Procedure Laterality Date   COLONOSCOPY WITH PROPOFOL N/A 04/09/2018   Procedure: COLONOSCOPY WITH PROPOFOL;  Surgeon: Jonathon Bellows, MD;  Location: [facility] ENDOSCOPY;  Service: Gastroenterology;  Laterality: N/A;   KNEE ARTHROSCOPY WITH MENISCAL REPAIR Right 03/19/2017   Procedure: RIGHT PARTIAL MEDIAL MENISCECTOMY;  Surgeon: Leim Fabry, MD;  Location: [facility];  Service: Orthopedics;  Laterality: Right;   left knee surgery     WRIST SURGERY Left         05/16/2022    3:44 PM 05/05/2021    1:26 PM 09/26/2020    3:50 PM  Depression screen PHQ 2/9  Decreased Interest 0 0 1  Down, Depressed, Hopeless 0 0 0  PHQ - 2 Score 0 0 1  Altered sleeping  1 1  Tired, decreased energy  0 0  Change in appetite  0 0  Feeling bad or failure about yourself   0 0  Trouble  concentrating  0 0  Moving slowly or fidgety/restless  0 0  Suicidal thoughts  0 0  PHQ-9 Score  1 2  Difficult doing work/chores  Not difficult at all Not difficult at all    Social History   Tobacco Use   Smoking status: Never   Smokeless tobacco: Never  Vaping Use   Vaping Use: Never used  Substance Use Topics   Alcohol use: No   Drug use: No    Review of Systems Per HPI unless specifically indicated above     Objective:    BP 120/80   Pulse 88   Ht 5\' 7"  (1.702 m)   Wt 272 lb (123.4 kg)   SpO2 99%   BMI 42.60 kg/m   Wt Readings from Last 3 Encounters:  06/12/22 272 lb (123.4 kg)  05/16/22 278 lb (126.1 kg)  12/07/21 280 lb (127 kg)    Physical Exam Vitals and nursing note reviewed.  Constitutional:      General: He is not in acute distress.    Appearance: He is well-developed. He is not diaphoretic.     Comments: Well-appearing, comfortable, cooperative  HENT:     Head: Normocephalic and atraumatic.  Eyes:     General:        Right eye: No discharge.        Left eye: No discharge.     Conjunctiva/sclera: Conjunctivae  normal.  Neck:     Thyroid: No thyromegaly.  Cardiovascular:     Rate and Rhythm: Normal rate and regular rhythm.     Pulses: Normal pulses.     Heart sounds: Normal heart sounds. No murmur heard. Pulmonary:     Effort: Pulmonary effort is normal. No respiratory distress.     Breath sounds: Normal breath sounds. No wheezing or rales.  Abdominal:     General: Bowel sounds are normal. There is no distension.     Palpations: There is no mass.     Tenderness: There is no abdominal tenderness. There is no guarding or rebound.     Hernia: No hernia is present.     Comments: Negative Murphy's sign on inspiration. Negative mcburney lower RLQ.  Musculoskeletal:        General: Normal range of motion.     Cervical back: Normal range of motion and neck supple.  Lymphadenopathy:     Cervical: No cervical adenopathy.  Skin:    General: Skin  is warm and dry.     Findings: No erythema or rash.  Neurological:     Mental Status: He is alert and oriented to person, place, and time. Mental status is at baseline.  Psychiatric:        Behavior: Behavior normal.     Comments: Well groomed, good eye contact, normal speech and thoughts    I have personally reviewed the radiology report from 06/12/22 on [facility] X-ray.  CLINICAL DATA:  R sided upper abdominal pain, bloating, belching, some constipation   EXAM: ABDOMEN - 1 VIEW   COMPARISON:  None Available.   FINDINGS: Stomach and small bowel are nondistended. Moderate fecal material in the proximal colon, decompressed distally. Bilateral pelvic phleboliths. Mild spondylitic changes in the lower lumbar spine.   IMPRESSION: Nonobstructive bowel gas pattern.     Electronically Signed   By: Lucrezia Europe M.D.   On: 06/12/2022 14:29   Results for orders placed or performed in visit on 05/16/22  Comprehensive metabolic panel  Result Value Ref Range   Glucose 87 70 - 99 mg/dL   BUN 9 6 - 24 mg/dL   Creatinine, Ser 0.96 0.76 - 1.27 mg/dL   eGFR 94 >59 mL/min/1.73   BUN/Creatinine Ratio 9 9 - 20   Sodium 136 134 - 144 mmol/L   Potassium 4.3 3.5 - 5.2 mmol/L   Chloride 98 96 - 106 mmol/L   CO2 25 20 - 29 mmol/L   Calcium 9.8 8.7 - 10.2 mg/dL   Total Protein 8.0 6.0 - 8.5 g/dL   Albumin 4.6 3.8 - 4.9 g/dL   Globulin, Total 3.4 1.5 - 4.5 g/dL   Albumin/Globulin Ratio 1.4 1.2 - 2.2   Bilirubin Total 0.4 0.0 - 1.2 mg/dL   Alkaline Phosphatase 90 44 - 121 IU/L   AST 41 (H) 0 - 40 IU/L   ALT 65 (H) 0 - 44 IU/L  TSH  Result Value Ref Range   TSH 2.310 0.450 - 4.500 uIU/mL  PSA  Result Value Ref Range   Prostate Specific Ag, Serum 1.6 0.0 - 4.0 ng/mL  Hemoglobin A1c  Result Value Ref Range   Hgb A1c MFr Bld 5.6 4.8 - 5.6 %   Est. average glucose Bld gHb Est-mCnc 114 mg/dL  Lipid panel  Result Value Ref Range   Cholesterol, Total 177 100 - 199 mg/dL   Triglycerides 101 0 -  149 mg/dL   HDL 33 (L) >39 mg/dL  VLDL Cholesterol Cal 19 5 - 40 mg/dL   LDL Chol Calc (NIH) 125 (H) 0 - 99 mg/dL   Chol/HDL Ratio 5.4 (H) 0.0 - 5.0 ratio  CBC with Differential/Platelet  Result Value Ref Range   WBC 5.7 3.4 - 10.8 x10E3/uL   RBC 5.23 4.14 - 5.80 x10E6/uL   Hemoglobin 14.6 13.0 - 17.7 g/dL   Hematocrit 43.5 37.5 - 51.0 %   MCV 83 79 - 97 fL   MCH 27.9 26.6 - 33.0 pg   MCHC 33.6 31.5 - 35.7 g/dL   RDW 14.5 11.6 - 15.4 %   Platelets 331 150 - 450 x10E3/uL   Neutrophils 30 Not Estab. %   Lymphs 60 Not Estab. %   Monocytes 8 Not Estab. %   Eos 1 Not Estab. %   Basos 1 Not Estab. %   Neutrophils Absolute 1.7 1.4 - 7.0 x10E3/uL   Lymphocytes Absolute 3.4 (H) 0.7 - 3.1 x10E3/uL   Monocytes Absolute 0.4 0.1 - 0.9 x10E3/uL   EOS (ABSOLUTE) 0.1 0.0 - 0.4 x10E3/uL   Basophils Absolute 0.0 0.0 - 0.2 x10E3/uL   Immature Granulocytes 0 Not Estab. %   Immature Grans (Abs) 0.0 0.0 - 0.1 x10E3/uL      Assessment & Plan:   Problem List Items Addressed This Visit     Constipation   Relevant Orders   DG Abd 1 View (Completed)   Other Visit Diagnoses     RUQ abdominal pain    -  Primary   Relevant Medications   omeprazole (PRILOSEC) 40 MG capsule   Other Relevant Orders   DG Abd 1 View (Completed)   US Abdomen Limited RUQ (LIVER/GB)   Gastroesophageal reflux disease, unspecified whether esophagitis present       Relevant Medications   omeprazole (PRILOSEC) 40 MG capsule   Abdominal bloating       Relevant Orders   DG Abd 1 View (Completed)       Uncertain etiology RUQ Abdominal pain, recent onset Not a chronic problem History of very vague and not entirely linked to digestive symptoms Considering broad differential Some constipation history to suggest and gas bloating belching Possible GERD Cannot rule out RUQ Cholelithiasis symptoms No sign of kidney stone or seems less likely MSK etiology based on factors   Plan:  [facility] today, reviewed, negative for  abnormal bowel stool pattern.  Results to mychart. Will proceed as below  1. Discussed differential - will start with work-up for RUQ GB - ordered abdominal US 2. PPI therapy Omeprazole 40mg  daily x 2-4 weeks 3. Hold NSAIDs 4. Avoid large fried fatty meals  Return criteria reviewed   See below for comments to patient on results  X-ray looks mostly normal. There is some moderate stool in colon, but does not appear to be enough to be sign of significant constipation. No kidney stones seen either.  Recommend next phase would be RUQ Abdominal Ultrasound to check for gallstones and start the stomach acid medication. Order will be sent to pharmacy and referral for imaging. They will call to schedule.  Orders Placed This Encounter  Procedures   DG Abd 1 View    Standing Status:   Future    Number of Occurrences:   1    Standing Expiration Date:   09/10/2022    Order Specific Question:   Reason for Exam (SYMPTOM  OR DIAGNOSIS REQUIRED)    Answer:   R sided upper abdominal pain, bloating, belching, some constipation  Order Specific Question:   Preferred imaging location?    Answer:   [facility]   US Abdomen Limited RUQ (LIVER/GB)    Standing Status:   Future    Standing Expiration Date:   06/13/2023    Order Specific Question:   Reason for Exam (SYMPTOM  OR DIAGNOSIS REQUIRED)    Answer:   RUQ abdominal pain episodic, rule out gallstones    Order Specific Question:   Preferred imaging location?    Answer:   [facility]      Meds ordered this encounter  Medications   omeprazole (PRILOSEC) 40 MG capsule    Sig: Take 1 capsule (40 mg total) by mouth daily before breakfast.    Dispense:  30 capsule    Refill:  1    Follow up plan: Return if symptoms worsen or fail to improve.    Saralyn Pilar, DO [facility] 06/12/2022, 1:46 PM

## 2022-06-12 NOTE — Patient Instructions (Addendum)
Thank you for coming to the office today.  Possible Constipation  STAT X-ray today of abdomen. We will send results to mychart by end of day.  If it is constipation - then try the Miralax powder clean out.   For Constipation (less frequent bowel movement that can be hard dry or involve straining).  Recommend trying OTC Miralax 17g = 1 capful in large glass water once daily for now, try several days to see if working, goal is soft stool or BM 1-2 times daily, if too loose then reduce dose or try every other day. If not effective may need to increase it to 2 doses at once in AM or may do 1 in morning and 1 in afternoon/evening  - This medicine is very safe and can be used often without any problem and will not make you dehydrated. It is good for use on AS NEEDED BASIS or even MAINTENANCE therapy for longer term for several days to weeks at a time to help regulate bowel movements  Other more natural remedies or preventative treatment: - Increase hydration with water - Increase fiber in diet (high fiber foods = vegetables, leafy greens, oats/grains) - May take OTC Fiber supplement (metamucil powder or pill/gummy) - May try OTC Probiotic  --------------------------------------------  I am not sure the exact cause of your abdominal pain, however I am concerned that one significant possibility could be - Gallstones, this can cause episodic gallbladder pain that you may be describing - Potentially uncontrolled Acid Reflux (GERD) and may have developed an Ulcer (Peptic Ulcer of stomach) - this seems less likely to me  IF the X-ray is NORMAL   We will schedule a RUQ Abdominal Ultrasound for evaluating gallbladder within a week approx, this is done at [facility] or Outpatient Imaging, you will be called with apt.  Start Omeprazole 40mg  daily before breakfast 15-20 min before, for approximately 2-4 weeks to see if effective, if it resolves the pain - we think it was likely due to acid reflux ulcer. May  need 4-8 weeks on medicine to heal.   DIET RECOMMENDATIONS - Avoid spicy, greasy, fried foods, also things like caffeine, dark chocolate, peppermint can worsen - Avoid large meals and late night snacks, also do not go more than 4-5 hours without a snack or meal (not eating will worsen reflux symptoms due to stomach acid) - You may also elevate the head of your bed at night to sleep at very slight incline to help reduce symptoms   If symptoms are worsening or persistent despite treatment or develop any different severe esophagus or abdominal pain, unable to swallow solids or liquids, nausea, vomiting especially blood in vomit, fever/chills, or unintentional weight loss / no appetite, please follow-up sooner in office or seek more immediate medical attention at hospital Emergency Department.   Regarding other medicines:   - STOP taking Ibuprofen, Advil, Motrin, Goody's / BC powder - DO NOT take without discussing with your doctor. These medicines can put you at high risk for future bleeding.   If need pain medicine, may take Tylenol Extra Strength (Acetaminophen) 500mg  tabs - take 1 to 2 tabs per dose (max 1000mg ) every 6-8 hours for pain (take regularly, don't skip a dose for next 7 days), max 24 hour daily dose is 6 tablets or 3000mg . In the future you can repeat the same everyday Tylenol course for 1-2 weeks at a time.    Please schedule a Follow-up Appointment to: Return if symptoms worsen or fail to  improve.  If you have any other questions or concerns, please feel free to call the office or send a message through [place]. You may also schedule an earlier appointment if necessary.  Additionally, you may be receiving a survey about your experience at our office within a few days to 1 week by e-mail or mail. We value your feedback.  Nobie Putnam, DO [place]

## 2022-06-21 ENCOUNTER — Ambulatory Visit
Admission: RE | Admit: 2022-06-21 | Discharge: 2022-06-21 | Disposition: A | Discharge status: Home / Self Care | Payer: BC Managed Care – PPO | Source: Ambulatory Visit | Attending: Family Medicine | Admitting: Family Medicine

## 2022-06-21 DIAGNOSIS — R1011 Right upper quadrant pain: Secondary | ICD-10-CM | POA: Insufficient documentation

## 2022-06-21 DIAGNOSIS — R109 Unspecified abdominal pain: Secondary | ICD-10-CM | POA: Diagnosis not present

## 2022-07-05 ENCOUNTER — Other Ambulatory Visit: Payer: Self-pay | Admitting: Family Medicine

## 2022-07-05 DIAGNOSIS — R1011 Right upper quadrant pain: Secondary | ICD-10-CM

## 2022-07-05 DIAGNOSIS — K219 Gastro-esophageal reflux disease without esophagitis: Secondary | ICD-10-CM

## 2022-07-05 NOTE — Telephone Encounter (Signed)
Unable to refill per protocol, Rx request is too soon. Last refill 06/12/22 for 30 and 1 refill.  Requested Prescriptions  Pending Prescriptions Disp Refills   omeprazole (PRILOSEC) 40 MG capsule [Pharmacy Med Name: OMEPRAZOLE DR 40 MG CAPSULE] 90 capsule 1    Sig: TAKE 1 CAPSULE BY MOUTH EVERY DAY BEFORE BREAKFAST     Gastroenterology: Proton Pump Inhibitors Passed - 07/05/2022  9:30 AM      Passed - Valid encounter within last 12 months    Recent Outpatient Visits           3 weeks ago RUQ abdominal pain   [place], DO   1 month ago Annual physical exam   [facility], Devonne Doughty, DO   7 months ago Morbid obesity with BMI of 40.0-44.9, adult [facility])   [place], DO   1 year ago Annual physical exam   [facility] [place], DO   1 year ago Plantar fasciitis, bilateral   [facility] [place], Devonne Doughty, [place]

## 2022-07-18 ENCOUNTER — Ambulatory Visit: Payer: BC Managed Care – PPO | Admitting: Family Medicine

## 2022-07-18 ENCOUNTER — Encounter: Payer: Self-pay | Admitting: Family Medicine

## 2022-07-18 VITALS — BP 134/78 | HR 74 | Ht 67.0 in | Wt 276.0 lb

## 2022-07-18 DIAGNOSIS — K219 Gastro-esophageal reflux disease without esophagitis: Secondary | ICD-10-CM

## 2022-07-18 DIAGNOSIS — R1011 Right upper quadrant pain: Secondary | ICD-10-CM

## 2022-07-18 DIAGNOSIS — K760 Fatty (change of) liver, not elsewhere classified: Secondary | ICD-10-CM

## 2022-07-18 MED ORDER — OMEPRAZOLE 40 MG PO CPDR: 40.0000 mg | DELAYED_RELEASE_CAPSULE | Freq: Every day | ORAL | 1 refills | Status: DC

## 2022-07-18 MED ORDER — SUCRALFATE 1 G PO TABS: 1.0000 g | ORAL_TABLET | Freq: Three times a day (TID) | ORAL | 2 refills | Status: DC

## 2022-07-18 NOTE — Patient Instructions (Addendum)
Thank you for coming to the office today.  I am glad you are improved but we will still continue the Omeprazole '40mg'$  daily, because it seems that it is working and likely this means there is issue with Stomach Acid Reflux, possible peptic ulcer or other gastritis cause.  I would suggest continuing medication for up to 3 months, and discussing this further with GI specialist for 2nd opinion and may warrant Endoscopy scope testing if not improved or if new concerns.  [place],  [address] Hours: 8AM-5PM Phone: [phone]   Please schedule a Follow-up Appointment to: Return if symptoms worsen or fail to improve.  If you have any other questions or concerns, please feel free to call the office or send a message through [place]. You may also schedule an earlier appointment if necessary.  Additionally, you may be receiving a survey about your experience at our office within a few days to 1 week by e-mail or mail. We value your feedback.  Nobie Putnam, DO [place]

## 2022-07-18 NOTE — Progress Notes (Signed)
Subjective:    Patient ID: Richard Wheeler, male    DOB: August 22, 1965, 57 y.o.   MRN: [id number]  Richard Wheeler is a 57 y.o. male presenting on 07/18/2022 for Abdominal Pain   HPI  Follow up GERD RUQ / Epigastric Abdominal pain  Last visit 06/12/22 - initial visit for same issue. See prior note. Started on PPI Omeprazole and testing with [facility] imaging. moderate stool but no kidney stone. Trial on Miralax. Ordered RUQ [place] > negative for GB disease  Now 5-6 weeks later he feels improved overall. Pain is reduced. Mild pain now. Seems diet was triggering it. Last visit Omeprazole '40mg'$  daily was started but not taking regularly sometimes taking later in day if not eat in AM If lay down on stomach would increase pressure on stomach,  Avoiding acidic foods and high caffeine or fried foods.  Denies any fever, aches, cough chills, nausea vomiting diarrhea      07/18/2022    3:26 PM 05/16/2022    3:44 PM 05/05/2021    1:26 PM  Depression screen PHQ 2/9  Decreased Interest 0 0 0  Down, Depressed, Hopeless 0 0 0  PHQ - 2 Score 0 0 0  Altered sleeping 1  1  Tired, decreased energy 0  0  Change in appetite 0  0  Feeling bad or failure about yourself  0  0  Trouble concentrating 0  0  Moving slowly or fidgety/restless 0  0  Suicidal thoughts 0  0  PHQ-9 Score 1  1  Difficult doing work/chores Not difficult at all  Not difficult at all    Social History   Tobacco Use   Smoking status: Never   Smokeless tobacco: Never  Vaping Use   Vaping Use: Never used  Substance Use Topics   Alcohol use: No   Drug use: No    Review of Systems Per HPI unless specifically indicated above     Objective:    BP 134/78 (BP Location: Left Arm, Cuff Size: Large)   Pulse 74   Ht '5\' 7"'$  (1.702 m)   Wt 276 lb (125.2 kg)   SpO2 99%   BMI 43.23 kg/m   Wt Readings from Last 3 Encounters:  07/18/22 276 lb (125.2 kg)  06/12/22 272 lb (123.4 kg)  05/16/22 278 lb (126.1 kg)    Physical Exam Vitals  and nursing note reviewed.  Constitutional:      General: He is not in acute distress.    Appearance: He is well-developed. He is obese. He is not diaphoretic.     Comments: Well-appearing, comfortable, cooperative  HENT:     Head: Normocephalic and atraumatic.  Eyes:     General:        Right eye: No discharge.        Left eye: No discharge.     Conjunctiva/sclera: Conjunctivae normal.  Neck:     Thyroid: No thyromegaly.  Cardiovascular:     Rate and Rhythm: Normal rate and regular rhythm.     Pulses: Normal pulses.     Heart sounds: Normal heart sounds. No murmur heard. Pulmonary:     Effort: Pulmonary effort is normal. No respiratory distress.     Breath sounds: Normal breath sounds. No wheezing or rales.  Abdominal:     Tenderness: There is no abdominal tenderness. There is no guarding or rebound. Negative signs include Murphy's sign and McBurney's sign.  Musculoskeletal:        General: Normal range of  motion.     Cervical back: Normal range of motion and neck supple.  Lymphadenopathy:     Cervical: No cervical adenopathy.  Skin:    General: Skin is warm and dry.     Findings: No erythema or rash.  Neurological:     Mental Status: He is alert and oriented to person, place, and time. Mental status is at baseline.  Psychiatric:        Behavior: Behavior normal.     Comments: Well groomed, good eye contact, normal speech and thoughts     I have personally reviewed the radiology report from 06/12/22 on [facility].  CLINICAL DATA: R sided upper abdominal pain, bloating, belching, some constipation  EXAM: ABDOMEN - 1 VIEW  COMPARISON: None Available.  FINDINGS: Stomach and small bowel are nondistended. Moderate fecal material in the proximal colon, decompressed distally. Bilateral pelvic phleboliths. Mild spondylitic changes in the lower lumbar spine.  IMPRESSION: Nonobstructive bowel gas pattern.   Electronically Signed By: Lucrezia Europe M.D. On: 06/12/2022 14:29     -----  I have personally reviewed the radiology report from 06/21/22 on [facility] [place].   Study Result CLINICAL DATA: Right upper quadrant abdomen pain for 2 weeks.  EXAM: ULTRASOUND ABDOMEN LIMITED RIGHT UPPER QUADRANT  COMPARISON: None Available.  FINDINGS: Gallbladder:  No gallstones or wall thickening visualized. No sonographic Murphy sign noted by sonographer.  Common bile duct:  Diameter: 4.8 mm  Liver:  No focal lesion identified. Nodular contour with diffuse increased echotexture of the liver. Portal vein is patent on color Doppler imaging with normal direction of blood flow towards the liver.  Other: None.  IMPRESSION: 1. No acute abnormality identified. 2. Nodular contour with diffuse increased echotexture of the liver, consistent with cirrhosis.   Electronically Signed By: Abelardo Diesel M.D. On: 06/21/2022 08:34  Results for orders placed or performed in visit on 05/16/22  Comprehensive metabolic panel  Result Value Ref Range   Glucose 87 70 - 99 mg/dL   BUN 9 6 - 24 mg/dL   Creatinine, Ser 0.96 0.76 - 1.27 mg/dL   eGFR 94 >59 mL/min/1.73   BUN/Creatinine Ratio 9 9 - 20   Sodium 136 134 - 144 mmol/L   Potassium 4.3 3.5 - 5.2 mmol/L   Chloride 98 96 - 106 mmol/L   CO2 25 20 - 29 mmol/L   Calcium 9.8 8.7 - 10.2 mg/dL   Total Protein 8.0 6.0 - 8.5 g/dL   Albumin 4.6 3.8 - 4.9 g/dL   Globulin, Total 3.4 1.5 - 4.5 g/dL   Albumin/Globulin Ratio 1.4 1.2 - 2.2   Bilirubin Total 0.4 0.0 - 1.2 mg/dL   Alkaline Phosphatase 90 44 - 121 IU/L   AST 41 (H) 0 - 40 IU/L   ALT 65 (H) 0 - 44 IU/L  TSH  Result Value Ref Range   TSH 2.310 0.450 - 4.500 uIU/mL  PSA  Result Value Ref Range   Prostate Specific Ag, Serum 1.6 0.0 - 4.0 ng/mL  Hemoglobin A1c  Result Value Ref Range   Hgb A1c MFr Bld 5.6 4.8 - 5.6 %   Est. average glucose Bld gHb Est-mCnc 114 mg/dL  Lipid panel  Result Value Ref Range   Cholesterol, Total 177 100 - 199 mg/dL   Triglycerides 101 0  - 149 mg/dL   HDL 33 (L) >39 mg/dL   VLDL Cholesterol Cal 19 5 - 40 mg/dL   LDL Chol Calc (NIH) 125 (H) 0 - 99 mg/dL   Chol/HDL  Ratio 5.4 (H) 0.0 - 5.0 ratio  CBC with Differential/Platelet  Result Value Ref Range   WBC 5.7 3.4 - 10.8 x10E3/uL   RBC 5.23 4.14 - 5.80 x10E6/uL   Hemoglobin 14.6 13.0 - 17.7 g/dL   Hematocrit 43.5 37.5 - 51.0 %   MCV 83 79 - 97 fL   MCH 27.9 26.6 - 33.0 pg   MCHC 33.6 31.5 - 35.7 g/dL   RDW 14.5 11.6 - 15.4 %   Platelets 331 150 - 450 x10E3/uL   Neutrophils 30 Not Estab. %   Lymphs 60 Not Estab. %   Monocytes 8 Not Estab. %   Eos 1 Not Estab. %   Basos 1 Not Estab. %   Neutrophils Absolute 1.7 1.4 - 7.0 x10E3/uL   Lymphocytes Absolute 3.4 (H) 0.7 - 3.1 x10E3/uL   Monocytes Absolute 0.4 0.1 - 0.9 x10E3/uL   EOS (ABSOLUTE) 0.1 0.0 - 0.4 x10E3/uL   Basophils Absolute 0.0 0.0 - 0.2 x10E3/uL   Immature Granulocytes 0 Not Estab. %   Immature Grans (Abs) 0.0 0.0 - 0.1 x10E3/uL      Assessment & Plan:   Problem List Items Addressed This Visit   None Visit Diagnoses     Gastroesophageal reflux disease, unspecified whether esophagitis present    -  Primary   Relevant Medications   omeprazole (PRILOSEC) 40 MG capsule   sucralfate (CARAFATE) 1 g tablet   Other Relevant Orders   Ambulatory referral to Gastroenterology   RUQ abdominal pain       Relevant Medications   omeprazole (PRILOSEC) 40 MG capsule   sucralfate (CARAFATE) 1 g tablet   Other Relevant Orders   Ambulatory referral to Gastroenterology   Fatty liver       Relevant Orders   Ambulatory referral to Gastroenterology       RUQ / Epigastric abdominal pain - suspected GERD given treatment response and other negative work up.  Continue Omeprazole '40mg'$  daily AM before meal, adjust dosing some.  Add Carafate AS NEEDED symptom relief  I would suggest continuing medication for up to 3 months, and discussing this further with GI specialist for 2nd opinion and may warrant Endoscopy scope  testing if not improved or if new concerns.  [place], [place] [address] Hours: 8AM-5PM Phone: [phone]  Orders Placed This Encounter  Procedures   Ambulatory referral to Gastroenterology    Referral Priority:   Routine    Referral Type:   Consultation    Referral Reason:   Specialty Services Required    Number of Visits Requested:   1     Meds ordered this encounter  Medications   omeprazole (PRILOSEC) 40 MG capsule    Sig: Take 1 capsule (40 mg total) by mouth daily before breakfast.    Dispense:  90 capsule    Refill:  1   sucralfate (CARAFATE) 1 g tablet    Sig: Take 1 tablet (1 g total) by mouth 4 (four) times daily -  with meals and at bedtime. As needed for indigestion GERD    Dispense:  30 tablet    Refill:  2    Follow up plan: Return if symptoms worsen or fail to improve.   Nobie Putnam, [facility] 07/18/2022, 4:03 PM

## 2023-03-04 ENCOUNTER — Ambulatory Visit: Payer: BC Managed Care – PPO | Admitting: Family Medicine

## 2023-03-04 ENCOUNTER — Encounter: Payer: Self-pay | Admitting: Family Medicine

## 2023-03-04 VITALS — BP 138/80 | HR 99 | Ht 67.0 in | Wt 284.0 lb

## 2023-03-04 DIAGNOSIS — I10 Essential (primary) hypertension: Secondary | ICD-10-CM

## 2023-03-04 DIAGNOSIS — G43909 Migraine, unspecified, not intractable, without status migrainosus: Secondary | ICD-10-CM | POA: Insufficient documentation

## 2023-03-04 DIAGNOSIS — H9313 Tinnitus, bilateral: Secondary | ICD-10-CM | POA: Insufficient documentation

## 2023-03-04 DIAGNOSIS — R7401 Elevation of levels of liver transaminase levels: Secondary | ICD-10-CM | POA: Diagnosis not present

## 2023-03-04 DIAGNOSIS — R1011 Right upper quadrant pain: Secondary | ICD-10-CM | POA: Diagnosis not present

## 2023-03-04 DIAGNOSIS — K7460 Unspecified cirrhosis of liver: Secondary | ICD-10-CM | POA: Diagnosis not present

## 2023-03-04 DIAGNOSIS — R1013 Epigastric pain: Secondary | ICD-10-CM | POA: Diagnosis not present

## 2023-03-04 NOTE — Progress Notes (Unsigned)
Subjective:    Patient ID: Richard Wheeler, male    DOB: [birth year], 57 y.o.   MRN: [id number]  Erwin Rasheed is a 57 y.o. male presenting on 03/04/2023 for Ear Pain (Left ear, for a few years, worsening, feels a knot at outer ear, believes this all began with wisdom tooth extraction) and Migraine   HPI  Left Ear Pain Past history of wisdom tooth extracted 10 years ago He has had episodic flares with Left ear external posterior knot / swelling and soreness  CHRONIC HTN - Interval update with BP at home readings in normal range. - Today patient reports no new concerns Current meds: Valsartan-HCTZ 160-25mg  daily, Amlodipine 2.5mg  Reports good compliance, took meds today. Tolerating well, w/o complaints. Lifestyle Weight stable - Exercise: goal to improve exercise regimen, now back on track Denies CP, dyspnea, HA, edema, dizziness / lightheadedness  ***Home BP readings checking 130s/80s  Bilateral Tinnitus / Migraine Headaches Prior military service disability with tinnitus, bilateral without significant hearing loss, but it does provoke migraine headaches.      03/04/2023    1:41 PM 07/18/2022    3:26 PM 05/16/2022    3:44 PM  Depression screen PHQ 2/9  Decreased Interest 0 0 0  Down, Depressed, Hopeless 1 0 0  PHQ - 2 Score 1 0 0  Altered sleeping 1 1   Tired, decreased energy 0 0   Change in appetite 0 0   Feeling bad or failure about yourself  0 0   Trouble concentrating 0 0   Moving slowly or fidgety/restless 0 0   Suicidal thoughts 0 0   PHQ-9 Score 2 1   Difficult doing work/chores Not difficult at all Not difficult at all     Social History   Tobacco Use   Smoking status: Never   Smokeless tobacco: Never  Vaping Use   Vaping status: Never Used  Substance Use Topics   Alcohol use: No   Drug use: No    Review of Systems Per HPI unless specifically indicated above     Objective:    BP 138/80 (BP Location: Left Arm, Cuff Size: Large)   Pulse 99    Ht 5\' 7"  (1.702 m)   Wt 284 lb (128.8 kg)   SpO2 96%   BMI 44.48 kg/m   Wt Readings from Last 3 Encounters:  03/04/23 284 lb (128.8 kg)  07/18/22 276 lb (125.2 kg)  06/12/22 272 lb (123.4 kg)    Physical Exam   Results for orders placed or performed in visit on 05/16/22  Comprehensive metabolic panel  Result Value Ref Range   Glucose 87 70 - 99 mg/dL   BUN 9 6 - 24 mg/dL   Creatinine, Ser 4.09 0.76 - 1.27 mg/dL   eGFR 94 >81 XB/JYN/8.29   BUN/Creatinine Ratio 9 9 - 20   Sodium 136 134 - 144 mmol/L   Potassium 4.3 3.5 - 5.2 mmol/L   Chloride 98 96 - 106 mmol/L   CO2 25 20 - 29 mmol/L   Calcium 9.8 8.7 - 10.2 mg/dL   Total Protein 8.0 6.0 - 8.5 g/dL   Albumin 4.6 3.8 - 4.9 g/dL   Globulin, Total 3.4 1.5 - 4.5 g/dL   Albumin/Globulin Ratio 1.4 1.2 - 2.2   Bilirubin Total 0.4 0.0 - 1.2 mg/dL   Alkaline Phosphatase 90 44 - 121 IU/L   AST 41 (H) 0 - 40 IU/L   ALT 65 (H) 0 - 44 IU/L  TSH  Result Value Ref Range   TSH 2.310 0.450 - 4.500 uIU/mL  PSA  Result Value Ref Range   Prostate Specific Ag, Serum 1.6 0.0 - 4.0 ng/mL  Hemoglobin A1c  Result Value Ref Range   Hgb A1c MFr Bld 5.6 4.8 - 5.6 %   Est. average glucose Bld gHb Est-mCnc 114 mg/dL  Lipid panel  Result Value Ref Range   Cholesterol, Total 177 100 - 199 mg/dL   Triglycerides 086 0 - 149 mg/dL   HDL 33 (L) >57 mg/dL   VLDL Cholesterol Cal 19 5 - 40 mg/dL   LDL Chol Calc (NIH) 846 (H) 0 - 99 mg/dL   Chol/HDL Ratio 5.4 (H) 0.0 - 5.0 ratio  CBC with Differential/Platelet  Result Value Ref Range   WBC 5.7 3.4 - 10.8 x10E3/uL   RBC 5.23 4.14 - 5.80 x10E6/uL   Hemoglobin 14.6 13.0 - 17.7 g/dL   Hematocrit 96.2 95.2 - 51.0 %   MCV 83 79 - 97 fL   MCH 27.9 26.6 - 33.0 pg   MCHC 33.6 31.5 - 35.7 g/dL   RDW 84.1 32.4 - 40.1 %   Platelets 331 150 - 450 x10E3/uL   Neutrophils 30 Not Estab. %   Lymphs 60 Not Estab. %   Monocytes 8 Not Estab. %   Eos 1 Not Estab. %   Basos 1 Not Estab. %   Neutrophils  Absolute 1.7 1.4 - 7.0 x10E3/uL   Lymphocytes Absolute 3.4 (H) 0.7 - 3.1 x10E3/uL   Monocytes Absolute 0.4 0.1 - 0.9 x10E3/uL   EOS (ABSOLUTE) 0.1 0.0 - 0.4 x10E3/uL   Basophils Absolute 0.0 0.0 - 0.2 x10E3/uL   Immature Granulocytes 0 Not Estab. %   Immature Grans (Abs) 0.0 0.0 - 0.1 x10E3/uL      Assessment & Plan:   Problem List Items Addressed This Visit     Bilateral tinnitus - Primary   Episodic migraine   Essential hypertension    No orders of the defined types were placed in this encounter.   Follow up plan: Return in about 3 months (around 06/04/2023) for 3 month fasting lab only then 1 week later Annual Physical (early PM apt).  Future labs ordered for ***  Saralyn Pilar, DO [facility] 03/04/2023, 1:59 PM

## 2023-03-04 NOTE — Patient Instructions (Addendum)
Thank you for coming to the office today.  I don't believe there is any problem with the lymph nodes or other swollen structure.  It sounds most likely to be the jaw / muscles itself, possibly triggered by the prior wisdom tooth extraction may have caused a jaw injury or misalignment. Overall, I would suggest return to a dentist for a dental X-ray of jaw and re-evaluation.  If needed, we can consider a CT scan or ultrasound here.  No other treatment required at this time.  Ear drum looks normal.  I have documented the tinnitus and migraines as well.  DUE for FASTING BLOOD WORK (no food or drink after midnight before the lab appointment, only water or coffee without cream/sugar on the morning of)  SCHEDULE "Lab Only" visit in the morning at the clinic for lab draw in 3 MONTHS   - Make sure Lab Only appointment is at about 1 week before your next appointment, so that results will be available  For Lab Results, once available within 2-3 days of blood draw, you can can log in to MyChart online to view your results and a brief explanation. Also, we can discuss results at next follow-up visit.   Please schedule a Follow-up Appointment to: Return in about 3 months (around 06/04/2023) for 3 month fasting lab only then 1 week later Annual Physical (early PM apt).  If you have any other questions or concerns, please feel free to call the office or send a message through MyChart. You may also schedule an earlier appointment if necessary.  Additionally, you may be receiving a survey about your experience at our office within a few days to 1 week by e-mail or mail. We value your feedback.  Saralyn Pilar, DO [facility], [place]

## 2023-03-05 ENCOUNTER — Other Ambulatory Visit: Payer: Self-pay | Admitting: Family Medicine

## 2023-03-05 ENCOUNTER — Encounter: Payer: Self-pay | Admitting: Family Medicine

## 2023-03-05 ENCOUNTER — Other Ambulatory Visit: Payer: Self-pay | Admitting: Gastroenterology

## 2023-03-05 DIAGNOSIS — N401 Enlarged prostate with lower urinary tract symptoms: Secondary | ICD-10-CM

## 2023-03-05 DIAGNOSIS — R7401 Elevation of levels of liver transaminase levels: Secondary | ICD-10-CM

## 2023-03-05 DIAGNOSIS — Z0000 Encounter for general adult medical examination without abnormal findings: Secondary | ICD-10-CM

## 2023-03-05 DIAGNOSIS — R7303 Prediabetes: Secondary | ICD-10-CM

## 2023-03-05 DIAGNOSIS — I10 Essential (primary) hypertension: Secondary | ICD-10-CM

## 2023-03-05 DIAGNOSIS — K7460 Unspecified cirrhosis of liver: Secondary | ICD-10-CM

## 2023-03-05 DIAGNOSIS — E782 Mixed hyperlipidemia: Secondary | ICD-10-CM

## 2023-03-05 NOTE — Progress Notes (Incomplete)
Subjective:    Patient ID: Richard Wheeler, male    DOB: [birth year], 57 y.o.   MRN: [id number]  Richard Wheeler is a 57 y.o. male presenting on 03/04/2023 for Ear Pain (Left ear, for a few years, worsening, feels a knot at outer ear, believes this all began with wisdom tooth extraction) and Migraine   HPI  Left Ear Pain Past history of wisdom tooth extracted 10 years ago He has had episodic flares with Left ear external posterior knot / swelling and soreness  CHRONIC HTN - Interval update with BP at home readings in normal range. - Today patient reports no new concerns Current meds: Valsartan-HCTZ 160-25mg  daily, Amlodipine 2.5mg  Reports good compliance, took meds today. Tolerating well, w/o complaints. Lifestyle Weight stable - Exercise: goal to improve exercise regimen, now back on track Denies CP, dyspnea, HA, edema, dizziness / lightheadedness  ***Home BP readings checking 130s/80s  Bilateral Tinnitus / Migraine Headaches Prior military service disability with tinnitus, bilateral without significant hearing loss, but it does provoke migraine headaches.      03/04/2023    1:41 PM 07/18/2022    3:26 PM 05/16/2022    3:44 PM  Depression screen PHQ 2/9  Decreased Interest 0 0 0  Down, Depressed, Hopeless 1 0 0  PHQ - 2 Score 1 0 0  Altered sleeping 1 1   Tired, decreased energy 0 0   Change in appetite 0 0   Feeling bad or failure about yourself  0 0   Trouble concentrating 0 0   Moving slowly or fidgety/restless 0 0   Suicidal thoughts 0 0   PHQ-9 Score 2 1   Difficult doing work/chores Not difficult at all Not difficult at all     Social History   Tobacco Use  . Smoking status: Never  . Smokeless tobacco: Never  Vaping Use  . Vaping status: Never Used  Substance Use Topics  . Alcohol use: No  . Drug use: No    Review of Systems Per HPI unless specifically indicated above     Objective:    BP 138/80 (BP Location: Left Arm, Cuff Size: Large)   Pulse  99   Ht 5\' 7"  (1.702 m)   Wt 284 lb (128.8 kg)   SpO2 96%   BMI 44.48 kg/m   Wt Readings from Last 3 Encounters:  03/04/23 284 lb (128.8 kg)  07/18/22 276 lb (125.2 kg)  06/12/22 272 lb (123.4 kg)    Physical Exam   Results for orders placed or performed in visit on 05/16/22  Comprehensive metabolic panel  Result Value Ref Range   Glucose 87 70 - 99 mg/dL   BUN 9 6 - 24 mg/dL   Creatinine, Ser 3.47 0.76 - 1.27 mg/dL   eGFR 94 >42 VZ/DGL/8.75   BUN/Creatinine Ratio 9 9 - 20   Sodium 136 134 - 144 mmol/L   Potassium 4.3 3.5 - 5.2 mmol/L   Chloride 98 96 - 106 mmol/L   CO2 25 20 - 29 mmol/L   Calcium 9.8 8.7 - 10.2 mg/dL   Total Protein 8.0 6.0 - 8.5 g/dL   Albumin 4.6 3.8 - 4.9 g/dL   Globulin, Total 3.4 1.5 - 4.5 g/dL   Albumin/Globulin Ratio 1.4 1.2 - 2.2   Bilirubin Total 0.4 0.0 - 1.2 mg/dL   Alkaline Phosphatase 90 44 - 121 IU/L   AST 41 (H) 0 - 40 IU/L   ALT 65 (H) 0 - 44 IU/L  TSH  Result Value Ref Range   TSH 2.310 0.450 - 4.500 uIU/mL  PSA  Result Value Ref Range   Prostate Specific Ag, Serum 1.6 0.0 - 4.0 ng/mL  Hemoglobin A1c  Result Value Ref Range   Hgb A1c MFr Bld 5.6 4.8 - 5.6 %   Est. average glucose Bld gHb Est-mCnc 114 mg/dL  Lipid panel  Result Value Ref Range   Cholesterol, Total 177 100 - 199 mg/dL   Triglycerides 696 0 - 149 mg/dL   HDL 33 (L) >29 mg/dL   VLDL Cholesterol Cal 19 5 - 40 mg/dL   LDL Chol Calc (NIH) 528 (H) 0 - 99 mg/dL   Chol/HDL Ratio 5.4 (H) 0.0 - 5.0 ratio  CBC with Differential/Platelet  Result Value Ref Range   WBC 5.7 3.4 - 10.8 x10E3/uL   RBC 5.23 4.14 - 5.80 x10E6/uL   Hemoglobin 14.6 13.0 - 17.7 g/dL   Hematocrit 41.3 24.4 - 51.0 %   MCV 83 79 - 97 fL   MCH 27.9 26.6 - 33.0 pg   MCHC 33.6 31.5 - 35.7 g/dL   RDW 01.0 27.2 - 53.6 %   Platelets 331 150 - 450 x10E3/uL   Neutrophils 30 Not Estab. %   Lymphs 60 Not Estab. %   Monocytes 8 Not Estab. %   Eos 1 Not Estab. %   Basos 1 Not Estab. %   Neutrophils  Absolute 1.7 1.4 - 7.0 x10E3/uL   Lymphocytes Absolute 3.4 (H) 0.7 - 3.1 x10E3/uL   Monocytes Absolute 0.4 0.1 - 0.9 x10E3/uL   EOS (ABSOLUTE) 0.1 0.0 - 0.4 x10E3/uL   Basophils Absolute 0.0 0.0 - 0.2 x10E3/uL   Immature Granulocytes 0 Not Estab. %   Immature Grans (Abs) 0.0 0.0 - 0.1 x10E3/uL      Assessment & Plan:   Problem List Items Addressed This Visit     Bilateral tinnitus - Primary   Episodic migraine   Essential hypertension    No orders of the defined types were placed in this encounter.   Follow up plan: Return in about 3 months (around 06/04/2023) for 3 month fasting lab only then 1 week later Annual Physical (early PM apt).  Future labs ordered for 05/2023  Saralyn Pilar, DO [facility] 03/04/2023, 1:59 PM

## 2023-03-14 ENCOUNTER — Ambulatory Visit
Admission: RE | Admit: 2023-03-14 | Discharge: 2023-03-14 | Disposition: A | Discharge status: Home / Self Care | Payer: BC Managed Care – PPO | Source: Ambulatory Visit | Attending: Gastroenterology | Admitting: Gastroenterology

## 2023-03-14 DIAGNOSIS — R7401 Elevation of levels of liver transaminase levels: Secondary | ICD-10-CM | POA: Diagnosis not present

## 2023-03-14 DIAGNOSIS — K7689 Other specified diseases of liver: Secondary | ICD-10-CM | POA: Diagnosis not present

## 2023-03-14 DIAGNOSIS — K7460 Unspecified cirrhosis of liver: Secondary | ICD-10-CM | POA: Diagnosis not present

## 2023-03-18 ENCOUNTER — Other Ambulatory Visit: Payer: Self-pay | Admitting: Family Medicine

## 2023-03-18 DIAGNOSIS — I10 Essential (primary) hypertension: Secondary | ICD-10-CM

## 2023-03-19 NOTE — Telephone Encounter (Signed)
Requested medication (s) are due for refill today:   Yes for both  Requested medication (s) are on the active medication list:   Yes for both  Future visit scheduled:   Yes 06/11/2023   Last ordered: Both 04/03/2022 #90, 3 refills  Unable to refill because labs are due per protocol.   Provider to review for refill prior to upcoming appt.   Requested Prescriptions  Pending Prescriptions Disp Refills   amLODipine (NORVASC) 2.5 MG tablet [Pharmacy Med Name: AMLODIPINE BESYLATE TABS 2.5MG ] 90 tablet 3    Sig: TAKE 1 TABLET DAILY     Cardiovascular: Calcium Channel Blockers 2 Passed - 03/18/2023  1:30 AM      Passed - Last BP in normal range    BP Readings from Last 1 Encounters:  03/04/23 138/80         Passed - Last Heart Rate in normal range    Pulse Readings from Last 1 Encounters:  03/04/23 99         Passed - Valid encounter within last 6 months    Recent Outpatient Visits           2 weeks ago Bilateral tinnitus   [place] [facility] [place], Netta Neat, DO   8 months ago Gastroesophageal reflux disease, unspecified whether esophagitis present   [place] [facility] Smitty Cords, DO   9 months ago RUQ abdominal pain   [place] [facility] [place], Netta Neat, DO   10 months ago Annual physical exam   [place] [facility] [place], Netta Neat, DO   1 year ago Morbid obesity with BMI of 40.0-44.9, adult [facility])   [place] [facility], Netta Neat, DO       Future Appointments             In 2 months Althea Charon, Netta Neat, DO  [facility], [facility]             valsartan-hydrochlorothiazide (DIOVAN-HCT) 160-25 MG tablet [Pharmacy Med Name: VALSARTAN/HCTZ TABS 160/25MG ] 90 tablet 3    Sig: TAKE 1 TABLET DAILY     Cardiovascular: ARB + Diuretic Combos Failed - 03/18/2023  1:30 AM      Failed - K in normal  range and within 180 days    Potassium  Date Value Ref Range Status  05/17/2022 4.3 3.5 - 5.2 mmol/L Final         Failed - Na in normal range and within 180 days    Sodium  Date Value Ref Range Status  05/17/2022 136 134 - 144 mmol/L Final         Failed - Cr in normal range and within 180 days    Creatinine, Ser  Date Value Ref Range Status  05/17/2022 0.96 0.76 - 1.27 mg/dL Final         Failed - eGFR is 10 or above and within 180 days    GFR calc Af Amer  Date Value Ref Range Status  02/29/2020 102 >59 mL/min/1.73 Final    Comment:    **In accordance with recommendations from the NKF-ASN Task force,**   [facility] is in the process of updating its eGFR calculation to the   2021 CKD-EPI creatinine equation that estimates kidney function   without a race variable.    GFR calc non Af Amer  Date Value Ref Range Status  02/29/2020 88 >59 mL/min/1.73 Final  eGFR  Date Value Ref Range Status  05/17/2022 94 >59 mL/min/1.73 Final         Passed - Patient is not pregnant      Passed - Last BP in normal range    BP Readings from Last 1 Encounters:  03/04/23 138/80         Passed - Valid encounter within last 6 months    Recent Outpatient Visits           2 weeks ago Bilateral tinnitus   [place] [facility] [place], Netta Neat, DO   8 months ago Gastroesophageal reflux disease, unspecified whether esophagitis present   [facility] Smitty Cords, DO   9 months ago RUQ abdominal pain   [place] [facility] Smitty Cords, DO   10 months ago Annual physical exam   [place] [facility] Smitty Cords, DO   1 year ago Morbid obesity with BMI of 40.0-44.9, adult [facility])   [place] [facility] [place], Netta Neat, DO       Future Appointments             In 2 months Althea Charon, Netta Neat, DO [place] [facility], [facility]

## 2023-03-27 ENCOUNTER — Other Ambulatory Visit: Payer: Self-pay | Admitting: Family Medicine

## 2023-03-27 DIAGNOSIS — N401 Enlarged prostate with lower urinary tract symptoms: Secondary | ICD-10-CM

## 2023-03-28 NOTE — Telephone Encounter (Signed)
Requested Prescriptions  Pending Prescriptions Disp Refills   tamsulosin (FLOMAX) 0.4 MG CAPS capsule [Pharmacy Med Name: TAMSULOSIN HCL CAPS 0.4MG ] 90 capsule 0    Sig: TAKE 1 CAPSULE DAILY AFTER BREAKFAST     Urology: Alpha-Adrenergic Blocker Passed - 03/27/2023  1:21 AM      Passed - PSA in normal range and within 360 days    Prostate Specific Ag, Serum  Date Value Ref Range Status  05/17/2022 1.6 0.0 - 4.0 ng/mL Final    Comment:    Roche ECLIA methodology. According to the American Urological Association, Serum PSA should decrease and remain at undetectable levels after radical prostatectomy. The AUA defines biochemical recurrence as an initial PSA value 0.2 ng/mL or greater followed by a subsequent confirmatory PSA value 0.2 ng/mL or greater. Values obtained with different assay methods or kits cannot be used interchangeably. Results cannot be interpreted as absolute evidence of the presence or absence of malignant disease.          Passed - Last BP in normal range    BP Readings from Last 1 Encounters:  03/04/23 138/80         Passed - Valid encounter within last 12 months    Recent Outpatient Visits           3 weeks ago Bilateral tinnitus   [place] [facility] [place], Netta Neat, DO   8 months ago Gastroesophageal reflux disease, unspecified whether esophagitis present   [facility] Smitty Cords, DO   9 months ago RUQ abdominal pain   [place] [facility] Smitty Cords, DO   10 months ago Annual physical exam   [place] [facility] Smitty Cords, DO   1 year ago Morbid obesity with BMI of 40.0-44.9, adult [facility])   [place] [facility], Netta Neat, DO       Future Appointments             In 2 months Althea Charon, Netta Neat, DO  [facility], [facility]

## 2023-04-22 DIAGNOSIS — M1711 Unilateral primary osteoarthritis, right knee: Secondary | ICD-10-CM | POA: Diagnosis not present

## 2023-05-31 ENCOUNTER — Encounter: Payer: Self-pay | Admitting: Emergency Medicine

## 2023-06-04 ENCOUNTER — Other Ambulatory Visit: Payer: BC Managed Care – PPO

## 2023-06-04 DIAGNOSIS — Z0000 Encounter for general adult medical examination without abnormal findings: Secondary | ICD-10-CM

## 2023-06-04 DIAGNOSIS — N401 Enlarged prostate with lower urinary tract symptoms: Secondary | ICD-10-CM

## 2023-06-04 DIAGNOSIS — R7303 Prediabetes: Secondary | ICD-10-CM | POA: Diagnosis not present

## 2023-06-04 DIAGNOSIS — I10 Essential (primary) hypertension: Secondary | ICD-10-CM | POA: Diagnosis not present

## 2023-06-04 DIAGNOSIS — E782 Mixed hyperlipidemia: Secondary | ICD-10-CM | POA: Diagnosis not present

## 2023-06-05 LAB — CBC WITH DIFFERENTIAL/PLATELET
Absolute Lymphocytes: 2846 {cells}/uL (ref 850–3900)
Absolute Monocytes: 389 {cells}/uL (ref 200–950)
Basophils Absolute: 38 {cells}/uL (ref 0–200)
Basophils Relative: 0.8 %
Eosinophils Absolute: 29 {cells}/uL (ref 15–500)
Eosinophils Relative: 0.6 %
HCT: 45.7 % (ref 38.5–50.0)
Hemoglobin: 15.3 g/dL (ref 13.2–17.1)
MCH: 28.3 pg (ref 27.0–33.0)
MCHC: 33.5 g/dL (ref 32.0–36.0)
MCV: 84.5 fL (ref 80.0–100.0)
MPV: 11.3 fL (ref 7.5–12.5)
Monocytes Relative: 8.1 %
Neutro Abs: 1498 {cells}/uL — ABNORMAL LOW (ref 1500–7800)
Neutrophils Relative %: 31.2 %
Platelets: 320 10*3/uL (ref 140–400)
RBC: 5.41 10*6/uL (ref 4.20–5.80)
RDW: 14.7 % (ref 11.0–15.0)
Total Lymphocyte: 59.3 %
WBC: 4.8 10*3/uL (ref 3.8–10.8)

## 2023-06-05 LAB — COMPLETE METABOLIC PANEL WITH GFR
AG Ratio: 1.3 (calc) (ref 1.0–2.5)
ALT: 43 U/L (ref 9–46)
AST: 24 U/L (ref 10–35)
Albumin: 4.5 g/dL (ref 3.6–5.1)
Alkaline phosphatase (APISO): 76 U/L (ref 35–144)
BUN: 9 mg/dL (ref 7–25)
CO2: 27 mmol/L (ref 20–32)
Calcium: 9.7 mg/dL (ref 8.6–10.3)
Chloride: 99 mmol/L (ref 98–110)
Creat: 1.05 mg/dL (ref 0.70–1.30)
Globulin: 3.4 g/dL (ref 1.9–3.7)
Glucose, Bld: 103 mg/dL — ABNORMAL HIGH (ref 65–99)
Potassium: 4.5 mmol/L (ref 3.5–5.3)
Sodium: 136 mmol/L (ref 135–146)
Total Bilirubin: 0.5 mg/dL (ref 0.2–1.2)
Total Protein: 7.9 g/dL (ref 6.1–8.1)
eGFR: 84 mL/min/{1.73_m2} (ref >=60)

## 2023-06-05 LAB — LIPID PANEL
Cholesterol: 180 mg/dL (ref <200)
HDL: 33 mg/dL — ABNORMAL LOW (ref >=40)
LDL Cholesterol (Calc): 125 mg/dL — ABNORMAL HIGH
Non-HDL Cholesterol (Calc): 147 mg/dL — ABNORMAL HIGH (ref <130)
Total CHOL/HDL Ratio: 5.5 (calc) — ABNORMAL HIGH (ref <5.0)
Triglycerides: 111 mg/dL (ref <150)

## 2023-06-05 LAB — HEMOGLOBIN A1C
Hgb A1c MFr Bld: 5.9 %{Hb} — ABNORMAL HIGH (ref <5.7)
Mean Plasma Glucose: 123 mg/dL
eAG (mmol/L): 6.8 mmol/L

## 2023-06-05 LAB — PSA: PSA: 1.61 ng/mL (ref <=4.00)

## 2023-06-05 LAB — TSH: TSH: 3.08 m[IU]/L (ref 0.40–4.50)

## 2023-06-06 ENCOUNTER — Encounter: Payer: Self-pay | Admitting: *Deleted

## 2023-06-07 ENCOUNTER — Ambulatory Visit
Admission: RE | Admit: 2023-06-07 | Discharge: 2023-06-07 | Disposition: A | Discharge status: Home / Self Care | Payer: BC Managed Care – PPO | Attending: Gastroenterology | Admitting: Gastroenterology

## 2023-06-07 ENCOUNTER — Encounter
Admission: RE | Disposition: A | Discharge status: Home / Self Care | Payer: Self-pay | Source: Home / Self Care | Attending: Gastroenterology

## 2023-06-07 ENCOUNTER — Ambulatory Visit: Payer: BC Managed Care – PPO | Admitting: Certified Registered"

## 2023-06-07 ENCOUNTER — Encounter: Payer: Self-pay | Admitting: *Deleted

## 2023-06-07 DIAGNOSIS — R519 Headache, unspecified: Secondary | ICD-10-CM | POA: Insufficient documentation

## 2023-06-07 DIAGNOSIS — K2970 Gastritis, unspecified, without bleeding: Secondary | ICD-10-CM | POA: Diagnosis not present

## 2023-06-07 DIAGNOSIS — K2960 Other gastritis without bleeding: Secondary | ICD-10-CM | POA: Diagnosis not present

## 2023-06-07 DIAGNOSIS — Z79899 Other long term (current) drug therapy: Secondary | ICD-10-CM | POA: Diagnosis not present

## 2023-06-07 DIAGNOSIS — K3189 Other diseases of stomach and duodenum: Secondary | ICD-10-CM | POA: Diagnosis not present

## 2023-06-07 DIAGNOSIS — K2950 Unspecified chronic gastritis without bleeding: Secondary | ICD-10-CM | POA: Diagnosis not present

## 2023-06-07 DIAGNOSIS — K7460 Unspecified cirrhosis of liver: Secondary | ICD-10-CM | POA: Insufficient documentation

## 2023-06-07 DIAGNOSIS — I10 Essential (primary) hypertension: Secondary | ICD-10-CM | POA: Insufficient documentation

## 2023-06-07 DIAGNOSIS — Z6841 Body Mass Index (BMI) 40.0 and over, adult: Secondary | ICD-10-CM | POA: Diagnosis not present

## 2023-06-07 DIAGNOSIS — E669 Obesity, unspecified: Secondary | ICD-10-CM | POA: Diagnosis not present

## 2023-06-07 DIAGNOSIS — R1011 Right upper quadrant pain: Secondary | ICD-10-CM | POA: Insufficient documentation

## 2023-06-07 HISTORY — PX: BIOPSY: SHX5522

## 2023-06-07 HISTORY — PX: ESOPHAGOGASTRODUODENOSCOPY (EGD) WITH PROPOFOL: SHX5813

## 2023-06-07 SURGERY — ESOPHAGOGASTRODUODENOSCOPY (EGD) WITH PROPOFOL
Anesthesia: General

## 2023-06-07 MED ORDER — LIDOCAINE HCL (CARDIAC) PF 100 MG/5ML IV SOSY
PREFILLED_SYRINGE | INTRAVENOUS | Status: DC | PRN
  Administered 2023-06-07: 100 mg via INTRAVENOUS

## 2023-06-07 MED ORDER — MIDAZOLAM HCL 2 MG/2ML IJ SOLN
INTRAMUSCULAR | Status: DC | PRN
  Administered 2023-06-07: 2 mg via INTRAVENOUS

## 2023-06-07 MED ORDER — SODIUM CHLORIDE 0.9 % IV SOLN
INTRAVENOUS | Status: DC
  Administered 2023-06-07: 250 mL via INTRAVENOUS

## 2023-06-07 MED ORDER — GLYCOPYRROLATE 0.2 MG/ML IJ SOLN
INTRAMUSCULAR | Status: DC | PRN
  Administered 2023-06-07: .2 mg via INTRAVENOUS

## 2023-06-07 MED ORDER — MIDAZOLAM HCL 2 MG/2ML IJ SOLN
INTRAMUSCULAR | Status: AC
  Filled 2023-06-07: qty 2

## 2023-06-07 MED ORDER — DEXMEDETOMIDINE HCL IN NACL 80 MCG/20ML IV SOLN
INTRAVENOUS | Status: DC | PRN
  Administered 2023-06-07: 12 ug via INTRAVENOUS

## 2023-06-07 MED ORDER — PROPOFOL 500 MG/50ML IV EMUL
INTRAVENOUS | Status: DC | PRN
  Administered 2023-06-07: 150 ug/kg/min via INTRAVENOUS

## 2023-06-07 MED ORDER — PROPOFOL 10 MG/ML IV BOLUS
INTRAVENOUS | Status: DC | PRN
  Administered 2023-06-07: 100 mg via INTRAVENOUS
  Administered 2023-06-07: 50 mg via INTRAVENOUS

## 2023-06-07 NOTE — Op Note (Signed)
[facility] Gastroenterology Patient Name: Richard Wheeler Procedure Date: 06/07/2023 9:23 AM MRN: [id number] Account #: [id number] Date of Birth: 1966/01/27 Admit Type: Outpatient Age: 58 Room: [facility] ENDO ROOM 3 Gender: Male Note Status: Finalized Instrument Name: Upper Endoscope [id number] Procedure:             Upper GI endoscopy Indications:           Abdominal pain in the right upper quadrant, Dyspepsia Providers:             Eather Colas MD, MD Referring MD:          Smitty Cords (Referring MD) Medicines:             Monitored Anesthesia Care Complications:         No immediate complications. Estimated blood loss:                         Minimal. Procedure:             Pre-Anesthesia Assessment:                        - Prior to the procedure, a History and Physical was                         performed, and patient medications and allergies were                         reviewed. The patient is competent. The risks and                         benefits of the procedure and the sedation options and                         risks were discussed with the patient. All questions                         were answered and informed consent was obtained.                         Patient identification and proposed procedure were                         verified by the physician, the nurse, the                         anesthesiologist, the anesthetist and the technician                         in the endoscopy suite. Mental Status Examination:                         alert and oriented. Airway Examination: normal                         oropharyngeal airway and neck mobility. Respiratory                         Examination: clear to auscultation. CV Examination:  normal. Prophylactic Antibiotics: The patient does not                         require prophylactic antibiotics. Prior                         Anticoagulants: The patient has  taken no anticoagulant                         or antiplatelet agents. ASA Grade Assessment: III - A                         patient with severe systemic disease. After reviewing                         the risks and benefits, the patient was deemed in                         satisfactory condition to undergo the procedure. The                         anesthesia plan was to use monitored anesthesia care                         (MAC). Immediately prior to administration of                         medications, the patient was re-assessed for adequacy                         to receive sedatives. The heart rate, respiratory                         rate, oxygen saturations, blood pressure, adequacy of                         pulmonary ventilation, and response to care were                         monitored throughout the procedure. The physical                         status of the patient was re-assessed after the                         procedure.                        After obtaining informed consent, the endoscope was                         passed under direct vision. Throughout the procedure,                         the patient's blood pressure, pulse, and oxygen                         saturations were monitored continuously. The Endoscope  was introduced through the mouth, and advanced to the                         second part of duodenum. The upper GI endoscopy was                         accomplished without difficulty. The patient tolerated                         the procedure well. Findings:      The examined esophagus was normal.      Patchy minimal inflammation characterized by erythema was found in the       gastric antrum. Biopsies were taken with a cold forceps for Helicobacter       pylori testing. Estimated blood loss was minimal.      A single 8 mm submucosal papule (nodule) with no bleeding and no       stigmata of recent bleeding was found in  the gastric antrum. The nodule       was slightly umbilicated and had the appearance of a pancreatic rest.       Biopsies were taken with a cold forceps for histology. Estimated blood       loss was minimal.      The examined duodenum was normal. Impression:            - Normal esophagus.                        - Gastritis. Biopsied.                        - A single submucosal papule (nodule) found in the                         stomach. Biopsied.                        - Normal examined duodenum. Recommendation:        - Discharge patient to home.                        - Resume previous diet.                        - Continue present medications.                        - Await pathology results.                        - Return to referring physician as previously                         scheduled. Procedure Code(s):     --- Professional ---                        [phone], Esophagogastroduodenoscopy, flexible,                         transoral; with biopsy, single or multiple Diagnosis Code(s):     --- Professional ---  K29.70, Gastritis, unspecified, without bleeding                        K31.89, Other diseases of stomach and duodenum                        R10.11, Right upper quadrant pain                        R10.13, Epigastric pain CPT copyright 2022 American Medical Association. All rights reserved. The codes documented in this report are preliminary and upon coder review may  be revised to meet current compliance requirements. Eather Colas MD, MD 06/07/2023 10:02:24 AM Number of Addenda: 0 Note Initiated On: 06/07/2023 9:23 AM Estimated Blood Loss:  Estimated blood loss was minimal.      [facility]

## 2023-06-07 NOTE — H&P (Signed)
Outpatient short stay form Pre-procedure 06/07/2023  Regis Bill, MD  Primary Physician: Smitty Cords, DO  Reason for visit:  Abdominal pain/Dyspepsia  History of present illness:    58 y/o gentleman with history of hypertension, obesity, and BPH here for EGD due to dyspepsia/abdominal pain. No blood thinners. No family history of GI malignancies. No significant abdominal surgeries.    Current Facility-Administered Medications:    0.9 %  sodium chloride infusion, , Intravenous, Continuous, Felissa Blouch, Rossie Muskrat, MD, Last Rate: 20 mL/hr at 06/07/23 0903, 250 mL at 06/07/23 0903  Medications Prior to Admission  Medication Sig Dispense Refill Last Dose/Taking   amLODipine (NORVASC) 2.5 MG tablet TAKE 1 TABLET DAILY 90 tablet 3 06/06/2023 Morning   Multiple Vitamin (MULTIVITAMIN WITH MINERALS) TABS tablet Take 1 tablet by mouth daily.   06/06/2023 Morning   tamsulosin (FLOMAX) 0.4 MG CAPS capsule TAKE 1 CAPSULE DAILY AFTER BREAKFAST 90 capsule 0 06/06/2023 Morning   valsartan-hydrochlorothiazide (DIOVAN-HCT) 160-25 MG tablet TAKE 1 TABLET DAILY 90 tablet 3 06/06/2023 Morning   omeprazole (PRILOSEC) 40 MG capsule Take 1 capsule (40 mg total) by mouth daily before breakfast. (Patient not taking: Reported on 03/04/2023) 90 capsule 1    sucralfate (CARAFATE) 1 g tablet Take 1 tablet (1 g total) by mouth 4 (four) times daily -  with meals and at bedtime. As needed for indigestion GERD (Patient not taking: Reported on 03/04/2023) 30 tablet 2      No Known Allergies   Past Medical History:  Diagnosis Date   Hypertension    Infected cuticle 03/08/2015   Left Hand 4th digit    Obesity     Review of systems:  Otherwise negative.    Physical Exam  Gen: Alert, oriented. Appears stated age.  HEENT: PERRLA. Lungs: No respiratory distress CV: RRR Abd: soft, benign, no masses Ext: No edema    Planned procedures: Proceed with EGD. The patient understands the nature of the  planned procedure, indications, risks, alternatives and potential complications including but not limited to bleeding, infection, perforation, damage to internal organs and possible oversedation/side effects from anesthesia. The patient agrees and gives consent to proceed.  Please refer to procedure notes for findings, recommendations and patient disposition/instructions.     Regis Bill, MD [facility]

## 2023-06-07 NOTE — Transfer of Care (Signed)
Immediate Anesthesia Transfer of Care Note  Patient: Karem Tomaso  Procedure(s) Performed: ESOPHAGOGASTRODUODENOSCOPY (EGD) WITH PROPOFOL BIOPSY  Patient Location: Endoscopy Unit  Anesthesia Type:General  Level of Consciousness: drowsy  Airway & Oxygen Therapy: Patient Spontanous Breathing and Patient connected to nasal cannula oxygen  Post-op Assessment: Report given to RN  Post vital signs: stable  Last Vitals:  Vitals Value Taken Time  BP    Temp    Pulse 99 06/07/23 1002  Resp    SpO2 90 % 06/07/23 1002  Vitals shown include unfiled device data.  Last Pain:  Vitals:   06/07/23 0848  TempSrc: Temporal  PainSc: 0-No pain         Complications: No notable events documented.

## 2023-06-07 NOTE — Interval H&P Note (Signed)
History and Physical Interval Note:  06/07/2023 9:37 AM  Richard Wheeler  has presented today for surgery, with the diagnosis of RUQ abd pain Cirrhosis Dyspepsia.  The various methods of treatment have been discussed with the patient and family. After consideration of risks, benefits and other options for treatment, the patient has consented to  Procedure(s): ESOPHAGOGASTRODUODENOSCOPY (EGD) WITH PROPOFOL (N/A) as a surgical intervention.  The patient's history has been reviewed, patient examined, no change in status, stable for surgery.  I have reviewed the patient's chart and labs.  Questions were answered to the patient's satisfaction.     Regis Bill  Ok to proceed with EGD

## 2023-06-07 NOTE — Anesthesia Postprocedure Evaluation (Signed)
Anesthesia Post Note  Patient: Richard Wheeler  Procedure(s) Performed: ESOPHAGOGASTRODUODENOSCOPY (EGD) WITH PROPOFOL BIOPSY  Patient location during evaluation: PACU Anesthesia Type: General Level of consciousness: awake and awake and alert Pain management: satisfactory to patient Vital Signs Assessment: post-procedure vital signs reviewed and stable Respiratory status: spontaneous breathing Cardiovascular status: stable Anesthetic complications: no   No notable events documented.   Last Vitals:  Vitals:   06/07/23 1022 06/07/23 1032  BP: (!) 139/94 (!) 117/93  Pulse: 91 83  Resp:    Temp:    SpO2: 97% 97%    Last Pain:  Vitals:   06/07/23 1002  TempSrc:   PainSc: Asleep                 VAN STAVEREN,Deaundra Kutzer

## 2023-06-07 NOTE — Anesthesia Preprocedure Evaluation (Signed)
Anesthesia Evaluation  Patient identified by MRN, date of birth, ID band Patient awake    Reviewed: Allergy & Precautions, NPO status , Patient's Chart, lab work & pertinent test results  Airway Mallampati: II  TM Distance: >3 FB Neck ROM: Full    Dental  (+) Teeth Intact   Pulmonary neg pulmonary ROS   Pulmonary exam normal breath sounds clear to auscultation       Cardiovascular Exercise Tolerance: Good hypertension, Pt. on medications negative cardio ROS Normal cardiovascular exam Rhythm:Regular Rate:Normal     Neuro/Psych  Headaches negative neurological ROS  negative psych ROS   GI/Hepatic negative GI ROS, Neg liver ROS,,,(+) Cirrhosis         Endo/Other  negative endocrine ROS  Class 3 obesity  Renal/GU negative Renal ROS  negative genitourinary   Musculoskeletal   Abdominal  (+) + obese  Peds negative pediatric ROS (+)  Hematology negative hematology ROS (+)   Anesthesia Other Findings Past Medical History: No date: Hypertension 03/08/2015: Infected cuticle     Comment:  Left Hand 4th digit  No date: Obesity  Past Surgical History: 04/09/2018: COLONOSCOPY WITH PROPOFOL; N/A     Comment:  Procedure: COLONOSCOPY WITH PROPOFOL;  Surgeon: Wyline Mood, MD;  Location: [facility] ENDOSCOPY;  Service:               Gastroenterology;  Laterality: N/A; 03/19/2017: KNEE ARTHROSCOPY WITH MENISCAL REPAIR; Right     Comment:  Procedure: RIGHT PARTIAL MEDIAL MENISCECTOMY;  Surgeon:               Signa Kell, MD;  Location: [facility];                Service: Orthopedics;  Laterality: Right; No date: left knee surgery No date: WRIST SURGERY; Left  BMI    Body Mass Index: 42.66 kg/m      Reproductive/Obstetrics negative OB ROS                             Anesthesia Physical Anesthesia Plan  ASA: 3  Anesthesia Plan: General   Post-op Pain Management:     Induction: Intravenous  PONV Risk Score and Plan: Propofol infusion and TIVA  Airway Management Planned: Natural Airway and Nasal Cannula  Additional Equipment:   Intra-op Plan:   Post-operative Plan:   Informed Consent: I have reviewed the patients History and Physical, chart, labs and discussed the procedure including the risks, benefits and alternatives for the proposed anesthesia with the patient or authorized representative who has indicated his/her understanding and acceptance.     Dental Advisory Given  Plan Discussed with: CRNA  Anesthesia Plan Comments:        Anesthesia Quick Evaluation

## 2023-06-10 ENCOUNTER — Encounter: Payer: Self-pay | Admitting: Gastroenterology

## 2023-06-11 ENCOUNTER — Encounter: Payer: BC Managed Care – PPO | Admitting: Family Medicine

## 2023-06-11 LAB — SURGICAL PATHOLOGY

## 2023-06-12 ENCOUNTER — Ambulatory Visit: Payer: BC Managed Care – PPO | Admitting: Family Medicine

## 2023-06-12 VITALS — BP 128/84 | HR 97 | Ht 68.0 in | Wt 276.0 lb

## 2023-06-12 DIAGNOSIS — I10 Essential (primary) hypertension: Secondary | ICD-10-CM

## 2023-06-12 DIAGNOSIS — Z6841 Body Mass Index (BMI) 40.0 and over, adult: Secondary | ICD-10-CM

## 2023-06-12 DIAGNOSIS — Z23 Encounter for immunization: Secondary | ICD-10-CM | POA: Diagnosis not present

## 2023-06-12 DIAGNOSIS — R7303 Prediabetes: Secondary | ICD-10-CM

## 2023-06-12 DIAGNOSIS — Z0000 Encounter for general adult medical examination without abnormal findings: Secondary | ICD-10-CM | POA: Diagnosis not present

## 2023-06-12 DIAGNOSIS — E782 Mixed hyperlipidemia: Secondary | ICD-10-CM | POA: Diagnosis not present

## 2023-06-12 DIAGNOSIS — G43909 Migraine, unspecified, not intractable, without status migrainosus: Secondary | ICD-10-CM

## 2023-06-12 DIAGNOSIS — R3912 Poor urinary stream: Secondary | ICD-10-CM

## 2023-06-12 DIAGNOSIS — N401 Enlarged prostate with lower urinary tract symptoms: Secondary | ICD-10-CM

## 2023-06-12 MED ORDER — RIZATRIPTAN BENZOATE 10 MG PO TBDP: 10.0000 mg | ORAL_TABLET | ORAL | 2 refills | Status: AC | PRN

## 2023-06-12 MED ORDER — TAMSULOSIN HCL 0.4 MG PO CAPS: 0.4000 mg | ORAL_CAPSULE | Freq: Every day | ORAL | 3 refills | Status: DC

## 2023-06-12 NOTE — Progress Notes (Signed)
 Subjective:    Patient ID: Richard Wheeler, male    DOB: 31-Jan-1966, 58 y.o.   MRN: [id number]  Richard Wheeler is a 58 y.o. male presenting on 06/12/2023 for Annual Exam   HPI  Discussed the use of AI scribe software for clinical note transcription with the patient, who gave verbal consent to proceed.  History of Present Illness    Richard Wheeler is a 58 year old male who presents for an annual physical exam.   Here for Annual Physical and lab Review.    Episodic Migraines / Tinnitus [facility] by [facility] 2-3 times per week Trigger with tinnitus No medication management Has upcoming follow-up  CHRONIC HTN / Morbid Obesity BMI >41 Improved BP Current meds: Valsartan -HCTZ 160-25mg  daily, Amlodipine  2.5mg  Reports good compliance, took meds today. Tolerating well, w/o complaints. Lifestyle Weight down 8 lbs with diet changes - Exercise: goal to improve exercise regimen, now back on track Denies CP, dyspnea, HA, edema, dizziness / lightheadedness  Pre-Diabetes Reports no concerns A1c 5.9 now, prior 5.6 to 5.9 Meds: none Denies hypoglycemia, polyuria, visual changes, numbness or tingling.    HYPERLIPIDEMIA: - Reports no concerns. Mild elevated LDL 125 Not on medicine statin   BPH with LUTS He was on OTC prostate supplement PRN with mixed results - Has not seen Urologist Taking Tamsulosin  0.4mg  daily with good results   Indigestion GERD PM    Health Maintenance:   Shingles vaccine eligible     Colon CA Screening: Last Colonoscopy 04/09/18 (done by Dr Therisa AGI), results with no polyp, good for 10 years - next due 2029. Currently asymptomatic. No known family history of colon CA. - Considering Cologuard in 2026   Prostate CA Screening: Last DRE mild enlarged 2018. PSA 1.6 similar to prior trend. Previously 1.8 to 2.4 range. Currently some worsening BPH LUTS. No known family history of prostate CA.  - Due for screening lab PSA will order now.        03/04/2023    1:41 PM 07/18/2022    3:26 PM 05/16/2022    3:44 PM  Depression screen PHQ 2/9  Decreased Interest 0 0 0  Down, Depressed, Hopeless 1 0 0  PHQ - 2 Score 1 0 0  Altered sleeping 1 1   Tired, decreased energy 0 0   Change in appetite 0 0   Feeling bad or failure about yourself  0 0   Trouble concentrating 0 0   Moving slowly or fidgety/restless 0 0   Suicidal thoughts 0 0   PHQ-9 Score 2 1   Difficult doing work/chores Not difficult at all Not difficult at all        03/04/2023    1:41 PM 07/18/2022    3:26 PM 05/05/2021    1:26 PM 09/26/2020    3:50 PM  GAD 7 : Generalized Anxiety Score  Nervous, Anxious, on Edge 0 1 0 0  Control/stop worrying 1 1 0 0  Worry too much - different things 0 1 0 1  Trouble relaxing 0 1 0 0  Restless 0 0 0 0  Easily annoyed or irritable 0 0 0 1  Afraid - awful might happen 0 1 0 0  Total GAD 7 Score 1 5 0 2  Anxiety Difficulty  Not difficult at all  Not difficult at all     Past Medical History:  Diagnosis Date   Hypertension    Infected cuticle 03/08/2015   Left Hand 4th digit  Obesity    Past Surgical History:  Procedure Laterality Date   BIOPSY  06/07/2023   Procedure: BIOPSY;  Surgeon: Maryruth Ole DASEN, MD;  Location: [facility] ENDOSCOPY;  Service: Endoscopy;;   COLONOSCOPY WITH PROPOFOL  N/A 04/09/2018   Procedure: COLONOSCOPY WITH PROPOFOL ;  Surgeon: Therisa Bi, MD;  Location: [facility] ENDOSCOPY;  Service: Gastroenterology;  Laterality: N/A;   ESOPHAGOGASTRODUODENOSCOPY (EGD) WITH PROPOFOL  N/A 06/07/2023   Procedure: ESOPHAGOGASTRODUODENOSCOPY (EGD) WITH PROPOFOL ;  Surgeon: Maryruth Ole DASEN, MD;  Location: [facility] ENDOSCOPY;  Service: Endoscopy;  Laterality: N/A;   KNEE ARTHROSCOPY WITH MENISCAL REPAIR Right 03/19/2017   Procedure: RIGHT PARTIAL MEDIAL MENISCECTOMY;  Surgeon: Tobie Priest, MD;  Location: [facility];  Service: Orthopedics;  Laterality: Right;   left knee surgery     WRIST SURGERY Left    Social  History   Socioeconomic History   Marital status: Married    Spouse name: Not on file   Number of children: Not on file   Years of education: Not on file   Highest education level: Associate degree: occupational, scientist, product/process development, or vocational program  Occupational History   Not on file  Tobacco Use   Smoking status: Never   Smokeless tobacco: Never  Vaping Use   Vaping status: Never Used  Substance and Sexual Activity   Alcohol use: No   Drug use: No   Sexual activity: Not on file  Other Topics Concern   Not on file  Social History Narrative   Not on file   Social Drivers of Health   Financial Resource Strain: Low Risk  (06/12/2023)   Overall Financial Resource Strain (CARDIA)    Difficulty of Paying Living Expenses: Not hard at all  Food Insecurity: No Food Insecurity (06/12/2023)   Hunger Vital Sign    Worried About Running Out of Food in the Last Year: Never true    Ran Out of Food in the Last Year: Never true  Transportation Needs: No Transportation Needs (06/12/2023)   PRAPARE - Administrator, Civil Service (Medical): No    Lack of Transportation (Non-Medical): No  Physical Activity: Insufficiently Active (06/12/2023)   Exercise Vital Sign    Days of Exercise per Week: 3 days    Minutes of Exercise per Session: 20 min  Stress: No Stress Concern Present (06/12/2023)   [employer] of Occupational Health - Occupational Stress Questionnaire    Feeling of Stress : Only a little  Social Connections: Socially Integrated (06/12/2023)   Social Connection and Isolation Panel [NHANES]    Frequency of Communication with Friends and Family: Three times a week    Frequency of Social Gatherings with Friends and Family: Once a week    Attends Religious Services: More than 4 times per year    Active Member of [employer] or Organizations: Yes    Attends Banker Meetings: 1 to 4 times per year    Marital Status: Married  Catering Manager Violence: Not on file   Family  History  Problem Relation Age of Onset   Cancer Mother 70   Multiple myeloma Mother    Hypertension Father    Hypertension Sister    Healthy Brother    Prostate cancer Neg Hx    Bladder Cancer Neg Hx    Kidney cancer Neg Hx    Colon cancer Neg Hx    Current Outpatient Medications on File Prior to Visit  Medication Sig   amLODipine  (NORVASC ) 2.5 MG tablet TAKE 1 TABLET DAILY  Multiple Vitamin (MULTIVITAMIN WITH MINERALS) TABS tablet Take 1 tablet by mouth daily.   valsartan -hydrochlorothiazide  (DIOVAN -HCT) 160-25 MG tablet TAKE 1 TABLET DAILY   No current facility-administered medications on file prior to visit.    Review of Systems  Constitutional:  Negative for activity change, appetite change, chills, diaphoresis, fatigue and fever.  HENT:  Negative for congestion and hearing loss.   Eyes:  Negative for visual disturbance.  Respiratory:  Negative for cough, chest tightness, shortness of breath and wheezing.   Cardiovascular:  Negative for chest pain, palpitations and leg swelling.  Gastrointestinal:  Negative for abdominal pain, constipation, diarrhea, nausea and vomiting.  Genitourinary:  Negative for dysuria, frequency and hematuria.  Musculoskeletal:  Negative for arthralgias and neck pain.  Skin:  Negative for rash.  Neurological:  Negative for dizziness, weakness, light-headedness, numbness and headaches.  Hematological:  Negative for adenopathy.  Psychiatric/Behavioral:  Negative for behavioral problems, dysphoric mood and sleep disturbance.    Per HPI unless specifically indicated above     Objective:    BP 128/84   Pulse 97   Ht 5' 8 (1.727 m)   Wt 276 lb (125.2 kg)   SpO2 95%   BMI 41.97 kg/m   Wt Readings from Last 3 Encounters:  06/12/23 276 lb (125.2 kg)  06/07/23 280 lb 9.3 oz (127.3 kg)  03/04/23 284 lb (128.8 kg)    Physical Exam Vitals and nursing note reviewed.  Constitutional:      General: He is not in acute distress.    Appearance: He is  well-developed. He is obese. He is not diaphoretic.     Comments: Well-appearing, comfortable, cooperative  HENT:     Head: Normocephalic and atraumatic.  Eyes:     General:        Right eye: No discharge.        Left eye: No discharge.     Conjunctiva/sclera: Conjunctivae normal.     Pupils: Pupils are equal, round, and reactive to light.  Neck:     Thyroid : No thyromegaly.     Vascular: No carotid bruit.  Cardiovascular:     Rate and Rhythm: Normal rate and regular rhythm.     Pulses: Normal pulses.     Heart sounds: Normal heart sounds. No murmur heard. Pulmonary:     Effort: Pulmonary effort is normal. No respiratory distress.     Breath sounds: Normal breath sounds. No wheezing or rales.  Abdominal:     General: Bowel sounds are normal. There is no distension.     Palpations: Abdomen is soft. There is no mass.     Tenderness: There is no abdominal tenderness.  Musculoskeletal:        General: No tenderness. Normal range of motion.     Cervical back: Normal range of motion and neck supple.     Right lower leg: No edema.     Left lower leg: No edema.     Comments: Upper / Lower Extremities: - Normal muscle tone, strength bilateral upper extremities 5/5, lower extremities 5/5  Lymphadenopathy:     Cervical: No cervical adenopathy.  Skin:    General: Skin is warm and dry.     Findings: No erythema or rash.  Neurological:     Mental Status: He is alert and oriented to person, place, and time.     Comments: Distal sensation intact to light touch all extremities  Psychiatric:        Mood and Affect: Mood normal.  Behavior: Behavior normal.        Thought Content: Thought content normal.     Comments: Well groomed, good eye contact, normal speech and thoughts     Results for orders placed or performed during the hospital encounter of 06/07/23  Surgical pathology   Collection Time: 06/07/23 12:00 AM  Result Value Ref Range   SURGICAL PATHOLOGY      SURGICAL  PATHOLOGY [facility] [address], [address] [place], [place] [address] Telephone [phone] or [phone] Fax [phone]  REPORT OF SURGICAL PATHOLOGY   Accession #: [id number] Patient Name: EANN, CLELAND Visit # : [id number]  MRN: [id number] Physician: Maryruth Smalls DOB/Age 09/02/1965 (Age: 60) Gender: M Collected Date: 06/07/2023 Received Date: 06/07/2023  FINAL DIAGNOSIS       1. Stomach, biopsy, cbx :       ANTRAL AND OXYNTIC MUCOSA WITH FOCAL CHRONIC REACITIVE GASTRITIS      IMMUNOSTAINS FOR H. PYLORIC ARE NEGATIVE       2. Stomach, biopsy, submucosal nodule, cbx :       ANTRAL AND OXYNTIC MUCOSA WITH CHRONIC REACTIVE GASTRITIS      BENIGN FRAGMENTS OF SUBMUCOSAL MUSCULARIS PROPRIA      NEGATIVE FOR DYSPLASIA OR MALIGNANCY       ELECTRONIC SIGNATURE : Stuart Md, Zhaoli, Sports Administrator, International Aid/development Worker  MICROSCOPIC DESCRIPTION  CASE COMMENTS STAINS USED IN DIAGNOSIS: H&E Universal Negative Contro l-DAB H&E Stains used in diagnosis 1 H. Pylori IHC Some of these immunohistochemical stains may have been developed and the performance characteristics determined by [facility].  Some may not have been cleared or approved by the U.S. Food and Drug Administration.  The FDA has determined that such clearance or approval is not necessary.  This test is used for clinical purposes.  It should not be regarded as investigational or for research.  This laboratory is certified under the Clinical Laboratory Improvement Amendments of 1988 (CLIA-88) as qualified to perform high complexity clinical laboratory testing.    CLINICAL HISTORY  SPECIMEN(S) OBTAINED 1. Stomach, biopsy, Cbx 2. Stomach, biopsy, Submucosal Nodule, Cbx  SPECIMEN COMMENTS: 1. R/O h.pylori SPECIMEN CLINICAL INFORMATION: 1. RUQ abd pain, cirrhosis, dyspepsia.Gastric nodule and gastrtits.    Gross Description 1. Received in formalin labeled with  the patient's name and Gastric cbx are f our 0.2-0.6 cm pieces of tan soft tissue, submitted in toto in a single cassette.(LEF, 06/07/2023) 2. Received in formalin labeled with the patient's name and Submucosal gastric nodule are four 0.2-0.3 cm pieces of tan soft tissue, submitted in toto in a single cassette.(LEF, 06/07/2023)        Report signed out from the following location(s) [facility] [address] [place], [place] [address] CLIA #: [id number]  [facility] [address] [place], [place] [address] CLIA #: [id number]       Assessment & Plan:   Problem List Items Addressed This Visit     Benign prostatic hyperplasia with weak urinary stream   Relevant Medications   tamsulosin  (FLOMAX ) 0.4 MG CAPS capsule   Episodic migraine   Relevant Medications   rizatriptan  (MAXALT -MLT) 10 MG disintegrating tablet   Essential hypertension   Relevant Orders   CT CARDIAC SCORING (SELF PAY ONLY)   RESOLVED: Hyperlipidemia   Relevant Orders   CT CARDIAC SCORING (SELF PAY ONLY)   Morbid obesity with BMI of 40.0-44.9, adult (HCC)   Relevant Orders   CT CARDIAC SCORING (SELF PAY  ONLY)   Pre-diabetes   Other Visit Diagnoses       Annual physical exam    -  Primary     Flu vaccine need       Relevant Orders   Flu vaccine trivalent PF, 6mos and older(Flulaval,Afluria,Fluarix,Fluzone) (Completed)        Updated Health Maintenance information Reviewed recent lab results with patient Encouraged improvement to lifestyle with diet and exercise Goal of weight loss   Prediabetes HbA1c 5.9, up slightly from last year. No medications needed at this time. -Continue lifestyle modifications including diet changes and weight loss. -Check HbA1c in 6 months.  Hyperlipidemia LDL 125, no current medications. Discussed potential need for statin therapy based on upcoming heart CT scan results. The 10-year ASCVD risk score (Arnett DK, et al.,  2019) is: 12%   Values used to calculate the score:     Age: 89 years     Sex: Male     Is Non-Hispanic African American: Yes     Diabetic: No     Tobacco smoker: No     Systolic Blood Pressure: 128 mmHg     Is BP treated: Yes     HDL Cholesterol: 33 mg/dL     Total Cholesterol: 180 mg/dL -Order Coronary Calcium heart CT scan to assess for coronary artery disease. -If scan shows blockages, consider starting statin therapy.  Episodic Migraines Trigger w/ tinnitus Occurring 2-3 times per week, currently managed by [facility] -Prescribe Rizatriptan  as abortive treatment, to be taken as needed for migraines.  Fatty Liver LFTs Elevated Liver enzymes normalized, likely due to diet changes and weight loss. -Continue current lifestyle modifications.  Hypertension Blood pressure controlled, currently on medication. -Continue current medications.  General Health Maintenance -Received influenza vaccine today. -Recommended Shingrix shingles vaccine at pharmacy. -Colonoscopy performed in 2019, discussed option of Cologuard home test in 2026 (year 7 since colonoscopy) -Continue Tamsulosin  for prostate, refills sent to [facility]. -Consider resuming stomach acid medication if over-the-counter treatments are not effective.         Orders Placed This Encounter  Procedures   CT CARDIAC SCORING (SELF PAY ONLY)    Standing Status:   Future    Expiration Date:   06/11/2024    Preferred imaging location?:   [facility]   Flu vaccine trivalent PF, 6mos and older(Flulaval,Afluria,Fluarix,Fluzone)    Meds ordered this encounter  Medications   tamsulosin  (FLOMAX ) 0.4 MG CAPS capsule    Sig: Take 1 capsule (0.4 mg total) by mouth daily after breakfast.    Dispense:  90 capsule    Refill:  3    Add refills   rizatriptan  (MAXALT -MLT) 10 MG disintegrating tablet    Sig: Take 1 tablet (10 mg total) by mouth as needed for migraine. May repeat in 2 hours if needed     Dispense:  10 tablet    Refill:  2     Follow up plan: Return for 6 month PreDM A1c HTN, migraine update.  Marsa Officer, DO [facility] 06/12/2023, 2:48 PM

## 2023-06-12 NOTE — Patient Instructions (Addendum)
 Thank you for coming to the office today.  BP looks good Liver enzymes back to normal  Rizatriptan  for migraines  You have been referred for a Coronary Calcium Score Cardiac CT Scan. This is a screening test for patients aged 56-50+ with cardiovascular risk factors or who are healthy but would be interested in Cardiovascular Screening for heart disease. Even if there is a family history of heart disease, this imaging can be useful. Typically it can be done every 5+ years or at a different timeline we agree on  The scan will look at the chest and mainly focus on the heart and identify early signs of calcium build up or blockages within the heart arteries. It is not 100% accurate for identifying blockages or heart disease, but it is useful to help us  predict who may have some early changes or be at risk in the future for a heart attack or cardiovascular problem.  The results are reviewed by a Cardiologist and they will document the results. It should become available on MyChart. Typically the results are divided into percentiles based on other patients of the same demographic and age. So it will compare your risk to others similar to you. If you have a higher score >99 or higher percentile >75%tile, it is recommended to consider Statin cholesterol therapy and or referral to Cardiologist. I will try to help explain your results and if we have questions we can contact the Cardiologist.  You will be contacted for scheduling. Usually it is done at any imaging facility through [facility], [facility] or [facility].  The cost is $99 flat fee total and it does not go through insurance, so no authorization is required.   Flu Vaccine today  Shingrix shingles vaccine at the pharmacy  Colon Cancer Screening:  THIS YEAR OR NEXT YEAR we can do Cologuard instead of Colonoscopy if interested  - For all adults age 34+ routine colon cancer screening is highly recommended.      - Recent guidelines from American Cancer Society recommend starting age of 16 - Early detection of colon cancer is important, because often there are no warning signs or symptoms, also if found early usually it can be cured. Late stage is hard to treat.   - If Cologuard is NEGATIVE, then it is good for 3 years before next due - If Cologuard is POSITIVE, then it is strongly advised to get a Colonoscopy, which allows the GI doctor to locate the source of the cancer or polyp (even very early stage) and treat it by removing it. ------------------------- Follow instructions to collect sample, you may call the company for any help or questions, 24/7 telephone support at [phone].    Please schedule a Follow-up Appointment to: Return for 6 month PreDM A1c HTN.  If you have any other questions or concerns, please feel free to call the office or send a message through MyChart. You may also schedule an earlier appointment if necessary.  Additionally, you may be receiving a survey about your experience at our office within a few days to 1 week by e-mail or mail. We value your feedback.  Marsa Officer, DO [facility], [place]

## 2023-06-17 ENCOUNTER — Encounter: Payer: Self-pay | Admitting: Family Medicine

## 2023-06-17 ENCOUNTER — Ambulatory Visit
Admission: RE | Admit: 2023-06-17 | Discharge: 2023-06-17 | Disposition: A | Discharge status: Home / Self Care | Payer: Self-pay | Source: Ambulatory Visit | Attending: Family Medicine | Admitting: Family Medicine

## 2023-06-17 DIAGNOSIS — Z6841 Body Mass Index (BMI) 40.0 and over, adult: Secondary | ICD-10-CM | POA: Insufficient documentation

## 2023-06-17 DIAGNOSIS — E782 Mixed hyperlipidemia: Secondary | ICD-10-CM | POA: Insufficient documentation

## 2023-06-17 DIAGNOSIS — I10 Essential (primary) hypertension: Secondary | ICD-10-CM | POA: Insufficient documentation

## 2023-11-05 DIAGNOSIS — M79674 Pain in right toe(s): Secondary | ICD-10-CM | POA: Diagnosis not present

## 2023-12-10 ENCOUNTER — Ambulatory Visit: Payer: BC Managed Care – PPO | Admitting: Family Medicine

## 2023-12-31 ENCOUNTER — Ambulatory Visit: Admitting: Family Medicine

## 2023-12-31 ENCOUNTER — Encounter: Payer: Self-pay | Admitting: Family Medicine

## 2023-12-31 VITALS — BP 134/84 | HR 80 | Ht 68.0 in | Wt 276.2 lb

## 2023-12-31 DIAGNOSIS — R7303 Prediabetes: Secondary | ICD-10-CM

## 2023-12-31 DIAGNOSIS — I10 Essential (primary) hypertension: Secondary | ICD-10-CM

## 2023-12-31 DIAGNOSIS — E782 Mixed hyperlipidemia: Secondary | ICD-10-CM

## 2023-12-31 DIAGNOSIS — R220 Localized swelling, mass and lump, head: Secondary | ICD-10-CM

## 2023-12-31 DIAGNOSIS — N401 Enlarged prostate with lower urinary tract symptoms: Secondary | ICD-10-CM

## 2023-12-31 DIAGNOSIS — R3912 Poor urinary stream: Secondary | ICD-10-CM

## 2023-12-31 DIAGNOSIS — Z6841 Body Mass Index (BMI) 40.0 and over, adult: Secondary | ICD-10-CM

## 2023-12-31 LAB — POCT GLYCOSYLATED HEMOGLOBIN (HGB A1C): Hemoglobin A1C: 5.6 % (ref 4.0–5.6)

## 2023-12-31 MED ORDER — AMLODIPINE BESYLATE 2.5 MG PO TABS
2.5000 mg | ORAL_TABLET | Freq: Every day | ORAL | 3 refills | Status: AC
Start: 2023-12-31 — End: ?

## 2023-12-31 MED ORDER — VALSARTAN-HYDROCHLOROTHIAZIDE 160-25 MG PO TABS: 1.0000 | ORAL_TABLET | Freq: Every day | ORAL | 3 refills | Status: AC

## 2023-12-31 NOTE — Progress Notes (Signed)
 Subjective:    Patient ID: Richard Wheeler, male    DOB: [birth year], 58 y.o.   MRN: [id number]  Richard Wheeler is a 58 y.o. male presenting on 12/31/2023 for Medical Management of Chronic Issues and Hypertension   HPI  Discussed the use of AI scribe software for clinical note transcription with the patient, who gave verbal consent to proceed.  History of Present Illness   Richard Wheeler is a 58 year old male with hypertension who presents for a routine follow-up visit.   Hyperlipidemia screening and cardiac risk assessment - Underwent a heart scan in February - LDL was 125 mg/dL seven months ago - Considering repeat cholesterol testing  Chronic postauricular lesion - Persistent mark behind the ear for 10-15 years, initially thought to be related to wisdom tooth extraction - Located near the jaw, sometimes palpable when sleeping on it - No new symptoms associated with the lesion       Episodic Migraines / Tinnitus [facility] 2-3 times per week Trigger with tinnitus No medication management Has upcoming follow-up   CHRONIC HTN / Morbid Obesity BMI >42 Home BP readings 120s-130s, here in office some slight elevation 140s Current meds: Valsartan -HCTZ 160-25mg  daily, Amlodipine  2.5mg  Reports good compliance, took meds today. Tolerating well, w/o complaints. Lifestyle Weight down 8 lbs with diet changes - Exercise: goal to improve exercise regimen, now back on track Denies CP, dyspnea, HA, edema, dizziness / lightheadedness   Pre-Diabetes A1c down to 5.6 prior 5.9 Meds: none Denies hypoglycemia, polyuria, visual changes, numbness or tingling.   BPH with LUTS He was on OTC prostate supplement PRN with mixed results - Has not seen Urologist Taking Tamsulosin  0.4mg  daily with good results   Indigestion GERD PM     Health Maintenance:   Shingles vaccine completed 1st dose Shingrix 08/21/23 next due for 2nd dose.   Prevnar-20 future.    Colon CA  Screening: Last Colonoscopy 04/09/18 (done by Dr Therisa AGI), results with no polyp, good for 10 years - next due 2029. Currently asymptomatic. No known family history of colon CA. - Considering Cologuard in 2026   Prostate CA Screening: Last DRE mild enlarged 2018. PSA 1.6 similar to prior trend. Previously 1.8 to 2.4 range. Currently some worsening BPH LUTS. No known family history of prostate CA.       12/31/2023    9:53 AM 03/04/2023    1:41 PM 07/18/2022    3:26 PM  Depression screen PHQ 2/9  Decreased Interest 0 0 0  Down, Depressed, Hopeless 0 1 0  PHQ - 2 Score 0 1 0  Altered sleeping 1 1 1   Tired, decreased energy  0 0  Change in appetite 0 0 0  Feeling bad or failure about yourself  0 0 0  Trouble concentrating 0 0 0  Moving slowly or fidgety/restless 0 0 0  Suicidal thoughts 0 0 0  PHQ-9 Score 1 2 1   Difficult doing work/chores Not difficult at all Not difficult at all Not difficult at all       12/31/2023    9:53 AM 03/04/2023    1:41 PM 07/18/2022    3:26 PM 05/05/2021    1:26 PM  GAD 7 : Generalized Anxiety Score  Nervous, Anxious, on Edge 0 0 1 0  Control/stop worrying 0 1 1 0  Worry too much - different things 1 0 1 0  Trouble relaxing 0 0 1 0  Restless 0 0 0 0  Easily annoyed  or irritable 0 0 0 0  Afraid - awful might happen 0 0 1 0  Total GAD 7 Score 1 1 5  0  Anxiety Difficulty Not difficult at all  Not difficult at all     Social History   Tobacco Use   Smoking status: Never   Smokeless tobacco: Never  Vaping Use   Vaping status: Never Used  Substance Use Topics   Alcohol use: No   Drug use: No    Review of Systems Per HPI unless specifically indicated above     Objective:    BP 134/84 (BP Location: Left Arm, Cuff Size: Normal)   Pulse 80   Ht 5' 8 (1.727 m)   Wt 276 lb 4 oz (125.3 kg)   SpO2 94%   BMI 42.00 kg/m   Wt Readings from Last 3 Encounters:  12/31/23 276 lb 4 oz (125.3 kg)  06/12/23 276 lb (125.2 kg)  06/07/23 280 lb 9.3  oz (127.3 kg)    Physical Exam Vitals and nursing note reviewed.  Constitutional:      General: He is not in acute distress.    Appearance: He is well-developed. He is obese. He is not diaphoretic.     Comments: Well-appearing, comfortable, cooperative  HENT:     Head: Normocephalic and atraumatic.  Eyes:     General:        Right eye: No discharge.        Left eye: No discharge.     Conjunctiva/sclera: Conjunctivae normal.  Neck:     Thyroid: No thyromegaly.     Comments: Left preauricular mass vs cyst structure stable chronic, some questionable post aurical area as well. Cardiovascular:     Rate and Rhythm: Normal rate and regular rhythm.     Pulses: Normal pulses.     Heart sounds: Normal heart sounds. No murmur heard. Pulmonary:     Effort: Pulmonary effort is normal. No respiratory distress.     Breath sounds: Normal breath sounds. No wheezing or rales.  Musculoskeletal:        General: Normal range of motion.     Cervical back: Normal range of motion and neck supple.  Lymphadenopathy:     Cervical: No cervical adenopathy.  Skin:    General: Skin is warm and dry.     Findings: No erythema or rash.  Neurological:     Mental Status: He is alert and oriented to person, place, and time. Mental status is at baseline.  Psychiatric:        Behavior: Behavior normal.     Comments: Well groomed, good eye contact, normal speech and thoughts     Results for orders placed or performed in visit on 12/31/23  POCT HgB A1C   Collection Time: 12/31/23  9:52 AM  Result Value Ref Range   Hemoglobin A1C 5.6 4.0 - 5.6 %   HbA1c POC (<> result, manual entry)     HbA1c, POC (prediabetic range)     HbA1c, POC (controlled diabetic range)        Assessment & Plan:   Problem List Items Addressed This Visit     Benign prostatic hyperplasia with weak urinary stream   Essential hypertension   Relevant Medications   amLODipine  (NORVASC ) 2.5 MG tablet   valsartan -hydrochlorothiazide   (DIOVAN -HCT) 160-25 MG tablet   Other Relevant Orders   CBC with Differential/Platelet   Comprehensive metabolic panel with GFR   Morbid obesity with BMI of 40.0-44.9, adult (HCC)  Pre-diabetes - Primary   Relevant Orders   POCT HgB A1C (Completed)   Other Visit Diagnoses       Mixed hyperlipidemia       Relevant Medications   amLODipine  (NORVASC ) 2.5 MG tablet   valsartan -hydrochlorothiazide  (DIOVAN -HCT) 160-25 MG tablet   Other Relevant Orders   Lipid panel     Preauricular mass       Relevant Orders   US  Soft Tissue Head/Neck (NON-THYROID)        Neck mass, right posterior auricular region, longstanding Longstanding neck mass, likely benign. - Order ultrasound of the neck soft tissue - Consider ENT referral based on ultrasound findings.  Essential hypertension Blood pressure controlled with current medication. Repeat manual improved Home readings controlled He can confirm dosage but our records show 160/25, [facility] 80[facility]12[facility]5 - Refill valsartan  160 mg/25 mg for 90 days via [facility]. - Monitor blood pressure at home. - Recheck blood pressure in office.  Hyperlipidemia Mildly elevated LDL, not on medication. - Order lipid panel.        Orders Placed This Encounter  Procedures   US  Soft Tissue Head/Neck (NON-THYROID)    Standing Status:   Future    Expiration Date:   12/30/2024    Reason for Exam (SYMPTOM  OR DIAGNOSIS REQUIRED):   Left preauricular fullness mass structure >10 years, non tender, question lymphadenopathy or mass    Preferred imaging location?:   [facility]   Lipid panel    Has the patient fasted?:   Yes   CBC with Differential/Platelet   Comprehensive metabolic panel with GFR    Has the patient fasted?:   Yes   POCT HgB A1C    Meds ordered this encounter  Medications   amLODipine  (NORVASC ) 2.5 MG tablet    Sig: Take 1 tablet (2.5 mg total) by mouth daily.    Dispense:  90 tablet    Refill:  3    Add future refills    valsartan -hydrochlorothiazide  (DIOVAN -HCT) 160-25 MG tablet    Sig: Take 1 tablet by mouth daily.    Dispense:  90 tablet    Refill:  3    Add future refills    Follow up plan: Return in about 6 months (around 07/02/2024) for 6 month [facility] orders then 1 week later Annual Physical.   Marsa Officer, DO [facility] 12/31/2023, 9:58 AM

## 2023-12-31 NOTE — Patient Instructions (Addendum)
 Thank you for coming to the office today.  Recent Labs    06/04/23 0801 12/31/23 0952  HGBA1C 5.9* 5.6   Improved sugar  Neck ultrasound ordered, they will contact you for scheduling.  [facility] at [facility] Address: [address] [place], [place] [address] Phone: [phone]  Message or call when ready before physical to do [facility] orders and we can arrange.  Refilled medications check dosage on BP med Valsartan -hydrochlorothiazide  it should be 160-25mg   Please schedule a Follow-up Appointment to: No follow-ups on file.  If you have any other questions or concerns, please feel free to call the office or send a message through MyChart. You may also schedule an earlier appointment if necessary.  Additionally, you may be receiving a survey about your experience at our office within a few days to 1 week by e-mail or mail. We value your feedback.  Marsa Officer, DO [facility], [place]

## 2024-01-01 LAB — LIPID PANEL
Chol/HDL Ratio: 5.3 ratio — ABNORMAL HIGH (ref 0.0–5.0)
Cholesterol, Total: 170 mg/dL (ref 100–199)
HDL: 32 mg/dL — ABNORMAL LOW (ref >39)
LDL Chol Calc (NIH): 121 mg/dL — ABNORMAL HIGH (ref 0–99)
Triglycerides: 93 mg/dL (ref 0–149)
VLDL Cholesterol Cal: 17 mg/dL (ref 5–40)

## 2024-01-01 LAB — COMPREHENSIVE METABOLIC PANEL WITH GFR
ALT: 31 IU/L (ref 0–44)
AST: 24 IU/L (ref 0–40)
Albumin: 4.3 g/dL (ref 3.8–4.9)
Alkaline Phosphatase: 84 IU/L (ref 44–121)
BUN/Creatinine Ratio: 9 (ref 9–20)
BUN: 9 mg/dL (ref 6–24)
Bilirubin Total: 0.4 mg/dL (ref 0.0–1.2)
CO2: 22 mmol/L (ref 20–29)
Calcium: 9.4 mg/dL (ref 8.7–10.2)
Chloride: 99 mmol/L (ref 96–106)
Creatinine, Ser: 1.03 mg/dL (ref 0.76–1.27)
Globulin, Total: 3.4 g/dL (ref 1.5–4.5)
Glucose: 82 mg/dL (ref 70–99)
Potassium: 4.6 mmol/L (ref 3.5–5.2)
Sodium: 137 mmol/L (ref 134–144)
Total Protein: 7.7 g/dL (ref 6.0–8.5)
eGFR: 85 mL/min/1.73 (ref >59)

## 2024-01-01 LAB — CBC WITH DIFFERENTIAL/PLATELET
Basophils Absolute: 0 x10E3/uL (ref 0.0–0.2)
Basos: 1 %
EOS (ABSOLUTE): 0 x10E3/uL (ref 0.0–0.4)
Eos: 0 %
Hematocrit: 46.2 % (ref 37.5–51.0)
Hemoglobin: 15.4 g/dL (ref 13.0–17.7)
Immature Grans (Abs): 0 x10E3/uL (ref 0.0–0.1)
Immature Granulocytes: 0 %
Lymphocytes Absolute: 2.4 x10E3/uL (ref 0.7–3.1)
Lymphs: 53 %
MCH: 28.5 pg (ref 26.6–33.0)
MCHC: 33.3 g/dL (ref 31.5–35.7)
MCV: 86 fL (ref 79–97)
Monocytes Absolute: 0.4 x10E3/uL (ref 0.1–0.9)
Monocytes: 9 %
Neutrophils Absolute: 1.7 x10E3/uL (ref 1.4–7.0)
Neutrophils: 37 %
Platelets: 308 x10E3/uL (ref 150–450)
RBC: 5.4 x10E6/uL (ref 4.14–5.80)
RDW: 14.5 % (ref 11.6–15.4)
WBC: 4.7 x10E3/uL (ref 3.4–10.8)

## 2024-01-02 ENCOUNTER — Ambulatory Visit: Payer: Self-pay | Admitting: Family Medicine

## 2024-01-02 DIAGNOSIS — K118 Other diseases of salivary glands: Secondary | ICD-10-CM

## 2024-01-09 ENCOUNTER — Ambulatory Visit
Admission: RE | Admit: 2024-01-09 | Discharge: 2024-01-09 | Disposition: A | Discharge status: Home / Self Care | Source: Ambulatory Visit | Attending: Family Medicine | Admitting: Family Medicine

## 2024-01-09 DIAGNOSIS — R220 Localized swelling, mass and lump, head: Secondary | ICD-10-CM | POA: Diagnosis not present

## 2024-01-09 DIAGNOSIS — R221 Localized swelling, mass and lump, neck: Secondary | ICD-10-CM | POA: Diagnosis not present

## 2024-01-23 DIAGNOSIS — D37030 Neoplasm of uncertain behavior of the parotid salivary glands: Secondary | ICD-10-CM | POA: Diagnosis not present

## 2024-01-27 NOTE — Progress Notes (Signed)
 Vanice Sharper, MD sent to [facility] for US  Core bx left parotid mass  TS

## 2024-01-29 ENCOUNTER — Other Ambulatory Visit: Payer: Self-pay | Admitting: Otolaryngology

## 2024-01-29 DIAGNOSIS — K118 Other diseases of salivary glands: Secondary | ICD-10-CM

## 2024-02-07 NOTE — Progress Notes (Signed)
 Patient for US  guided Core LT Parotid gland biopsy on Monday 02/10/24, I called and spoke with the patient on the phone and gave pre-procedure instructions. Pt was made aware to be here at 12:30p and check in at the [facility] registration desk. Pt stated understanding. Called 02/07/24

## 2024-02-10 ENCOUNTER — Ambulatory Visit
Admission: RE | Admit: 2024-02-10 | Discharge: 2024-02-10 | Disposition: A | Discharge status: Home / Self Care | Source: Ambulatory Visit | Attending: Otolaryngology | Admitting: Otolaryngology

## 2024-02-10 ENCOUNTER — Other Ambulatory Visit: Payer: Self-pay | Admitting: Otolaryngology

## 2024-02-10 DIAGNOSIS — K118 Other diseases of salivary glands: Secondary | ICD-10-CM | POA: Diagnosis not present

## 2024-02-10 DIAGNOSIS — R896 Abnormal cytological findings in specimens from other organs, systems and tissues: Secondary | ICD-10-CM | POA: Insufficient documentation

## 2024-02-10 MED ORDER — LIDOCAINE HCL (PF) 1 % IJ SOLN
5.0000 mL | Freq: Once | INTRAMUSCULAR | Status: AC
  Administered 2024-02-10: 5 mL via INTRADERMAL
  Filled 2024-02-10: qty 5

## 2024-02-10 NOTE — Procedures (Signed)
 Interventional Radiology Procedure:   Indications: Left parotid lesion  Procedure: US  guided FNA of left parotid lesion  Findings: Complex left parotid lesion anterior to left ear.  5 FNAs obtained.   Complications: None     EBL: Minimal  Plan: Discharge to home.   Reeva Davern R. Philip, MD  Pager: [phone]

## 2024-02-10 NOTE — Discharge Instructions (Signed)
 Reviewed with patient

## 2024-02-14 LAB — CYTOLOGY - NON PAP

## 2024-02-19 ENCOUNTER — Encounter: Payer: Self-pay | Admitting: Otolaryngology

## 2024-02-19 ENCOUNTER — Other Ambulatory Visit: Payer: Self-pay | Admitting: Otolaryngology

## 2024-02-19 DIAGNOSIS — K118 Other diseases of salivary glands: Secondary | ICD-10-CM

## 2024-02-25 ENCOUNTER — Ambulatory Visit
Admission: RE | Admit: 2024-02-25 | Discharge: 2024-02-25 | Disposition: A | Discharge status: Home / Self Care | Source: Ambulatory Visit | Attending: Otolaryngology | Admitting: Otolaryngology

## 2024-02-25 DIAGNOSIS — K118 Other diseases of salivary glands: Secondary | ICD-10-CM

## 2024-02-25 MED ORDER — IOPAMIDOL (ISOVUE-300) INJECTION 61%
75.0000 mL | Freq: Once | INTRAVENOUS | Status: AC | PRN
  Administered 2024-02-25: 75 mL via INTRAVENOUS

## 2024-03-13 DIAGNOSIS — K118 Other diseases of salivary glands: Secondary | ICD-10-CM | POA: Diagnosis not present

## 2024-03-19 DIAGNOSIS — K118 Other diseases of salivary glands: Secondary | ICD-10-CM | POA: Diagnosis not present

## 2024-03-20 DIAGNOSIS — K118 Other diseases of salivary glands: Secondary | ICD-10-CM | POA: Diagnosis not present

## 2024-04-06 DIAGNOSIS — K118 Other diseases of salivary glands: Secondary | ICD-10-CM | POA: Diagnosis not present

## 2024-04-16 DIAGNOSIS — H538 Other visual disturbances: Secondary | ICD-10-CM | POA: Diagnosis not present

## 2024-04-17 DIAGNOSIS — D110 Benign neoplasm of parotid gland: Secondary | ICD-10-CM | POA: Diagnosis not present

## 2024-04-17 DIAGNOSIS — I10 Essential (primary) hypertension: Secondary | ICD-10-CM | POA: Diagnosis not present

## 2024-04-23 DIAGNOSIS — D110 Benign neoplasm of parotid gland: Secondary | ICD-10-CM | POA: Diagnosis not present

## 2024-04-27 DIAGNOSIS — D110 Benign neoplasm of parotid gland: Secondary | ICD-10-CM | POA: Diagnosis not present

## 2024-06-09 ENCOUNTER — Other Ambulatory Visit: Payer: Self-pay | Admitting: Family Medicine

## 2024-06-09 DIAGNOSIS — N401 Enlarged prostate with lower urinary tract symptoms: Secondary | ICD-10-CM

## 2024-06-10 NOTE — Telephone Encounter (Signed)
 Requested medication (s) are due for refill today: yes  Requested medication (s) are on the active medication list: yes  Last refill:  06/12/23   Future visit scheduled: yes  Notes to clinic:  Unable to refill per protocol due to failed labs, no updated results.      Requested Prescriptions  Pending Prescriptions Disp Refills   tamsulosin  (FLOMAX ) 0.4 MG CAPS capsule [Pharmacy Med Name: TAMSULOSIN  HCL CAPS 0.4MG ] 90 capsule 3    Sig: TAKE 1 CAPSULE DAILY AFTER BREAKFAST     Urology: Alpha-Adrenergic Blocker Failed - 06/10/2024 12:18 PM      Failed - PSA in normal range and within 360 days    PSA  Date Value Ref Range Status  06/04/2023 1.61 < OR = 4.00 ng/mL Final    Comment:    The total PSA value from this assay system is  standardized against the WHO standard. The test  result will be approximately 20% lower when compared  to the equimolar-standardized total PSA (Beckman  Coulter). Comparison of serial PSA results should be  interpreted with this fact in mind. . This test was performed using the Siemens  chemiluminescent method. Values obtained from  different assay methods cannot be used interchangeably. PSA levels, regardless of value, should not be interpreted as absolute evidence of the presence or absence of disease.    Prostate Specific Ag, Serum  Date Value Ref Range Status  05/17/2022 1.6 0.0 - 4.0 ng/mL Final    Comment:    Roche ECLIA methodology. According to the American Urological Association, Serum PSA should decrease and remain at undetectable levels after radical prostatectomy. The AUA defines biochemical recurrence as an initial PSA value 0.2 ng/mL or greater followed by a subsequent confirmatory PSA value 0.2 ng/mL or greater. Values obtained with different assay methods or kits cannot be used interchangeably. Results cannot be interpreted as absolute evidence of the presence or absence of malignant disease.          Passed - Last BP in normal  range    BP Readings from Last 1 Encounters:  02/10/24 125/77         Passed - Valid encounter within last 12 months    Recent Outpatient Visits           5 months ago Pre-diabetes   [facility] [place] Marsa PARAS, DO   12 months ago Annual physical exam   [facility] [place], Marsa PARAS, [place]

## 2024-07-03 ENCOUNTER — Encounter: Admitting: Family Medicine
